# Patient Record
Sex: Female | Born: 1990 | Race: White | Hispanic: Yes | Marital: Married | Smoking: Never smoker
Health system: Southern US, Community
[De-identification: ages and names within clinical notes are randomized; demographics above are authoritative.]

## PROBLEM LIST (undated history)

## (undated) DIAGNOSIS — Z789 Other specified health status: Secondary | ICD-10-CM

## (undated) DIAGNOSIS — T7840XA Allergy, unspecified, initial encounter: Secondary | ICD-10-CM

## (undated) HISTORY — DX: Allergy, unspecified, initial encounter: T78.40XA

## (undated) HISTORY — PX: NO PAST SURGERIES: SHX2092

## (undated) HISTORY — DX: Other specified health status: Z78.9

---
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
## 2011-03-07 LAB — POCT RAPID STREP A: Streptococcus, Group A Screen (Direct): NEGATIVE

## 2011-05-25 ENCOUNTER — Inpatient Hospital Stay (HOSPITAL_COMMUNITY): Payer: Medicaid Other

## 2011-05-25 ENCOUNTER — Inpatient Hospital Stay (HOSPITAL_COMMUNITY)
Admission: AD | Admit: 2011-05-25 | Discharge: 2011-05-25 | Disposition: A | Discharge status: Home / Self Care | Payer: Medicaid Other | Source: Ambulatory Visit | Attending: Obstetrics & Gynecology | Admitting: Obstetrics & Gynecology

## 2011-05-25 ENCOUNTER — Encounter (HOSPITAL_COMMUNITY): Payer: Self-pay | Admitting: *Deleted

## 2011-05-25 DIAGNOSIS — O2390 Unspecified genitourinary tract infection in pregnancy, unspecified trimester: Secondary | ICD-10-CM | POA: Insufficient documentation

## 2011-05-25 DIAGNOSIS — O26899 Other specified pregnancy related conditions, unspecified trimester: Secondary | ICD-10-CM

## 2011-05-25 DIAGNOSIS — O219 Vomiting of pregnancy, unspecified: Secondary | ICD-10-CM

## 2011-05-25 DIAGNOSIS — B373 Candidiasis of vulva and vagina: Secondary | ICD-10-CM

## 2011-05-25 DIAGNOSIS — B3731 Acute candidiasis of vulva and vagina: Secondary | ICD-10-CM | POA: Insufficient documentation

## 2011-05-25 DIAGNOSIS — R109 Unspecified abdominal pain: Secondary | ICD-10-CM

## 2011-05-25 DIAGNOSIS — O210 Mild hyperemesis gravidarum: Secondary | ICD-10-CM | POA: Insufficient documentation

## 2011-05-25 LAB — CBC
MCH: 28.1 pg (ref 26.0–34.0)
MCV: 84.4 fL (ref 78.0–100.0)
Platelets: 189 10*3/uL (ref 150–400)
RDW: 14.7 % (ref 11.5–15.5)
WBC: 9.9 10*3/uL (ref 4.0–10.5)

## 2011-05-25 LAB — WET PREP, GENITAL: Clue Cells Wet Prep HPF POC: NONE SEEN

## 2011-05-25 LAB — COMPREHENSIVE METABOLIC PANEL
AST: 19 U/L (ref 0–37)
Albumin: 3.5 g/dL (ref 3.5–5.2)
Calcium: 10.2 mg/dL (ref 8.4–10.5)
Creatinine, Ser: 0.5 mg/dL (ref 0.50–1.10)
Total Protein: 7.4 g/dL (ref 6.0–8.3)

## 2011-05-25 LAB — URINALYSIS, ROUTINE W REFLEX MICROSCOPIC
Bilirubin Urine: NEGATIVE
Hgb urine dipstick: NEGATIVE
Nitrite: NEGATIVE
Protein, ur: NEGATIVE mg/dL
Urobilinogen, UA: 0.2 mg/dL (ref 0.0–1.0)

## 2011-05-25 LAB — URINE MICROSCOPIC-ADD ON

## 2011-05-25 MED ORDER — FLUCONAZOLE 150 MG PO TABS: 150.0000 mg | ORAL_TABLET | Freq: Once | ORAL | Status: AC

## 2011-05-25 MED ORDER — ONDANSETRON 8 MG PO TBDP: 8.0000 mg | ORAL_TABLET | Freq: Three times a day (TID) | ORAL | Status: AC | PRN

## 2011-05-25 MED ORDER — FLUCONAZOLE 150 MG PO TABS: 150.0000 mg | ORAL_TABLET | Freq: Once | ORAL | Status: DC

## 2011-05-25 MED ORDER — PROMETHAZINE HCL 25 MG PO TABS: 25.0000 mg | ORAL_TABLET | Freq: Four times a day (QID) | ORAL | Status: AC | PRN

## 2011-05-25 MED ORDER — ONDANSETRON 8 MG PO TBDP
8.0000 mg | ORAL_TABLET | Freq: Once | ORAL | Status: AC
  Administered 2011-05-25: 8 mg via ORAL
  Filled 2011-05-25: qty 1

## 2011-05-25 NOTE — ED Provider Notes (Signed)
History     Chief Complaint  Patient presents with  . Emesis   HPI Pt presents with nausea and vomiting in pregnancy.  She has been vomiting about 3 times a day.  She also complains of abdominal cramping on and off- she has not taken any medications for the pain.  She was diagnosed with at UTI at her [facility] and took AMmoxicillin.  She had some spotting 1 1/2 month ago.    No past medical history on file.  No past surgical history on file.  No family history on file.  History  Substance Use Topics  . Smoking status: Not on file  . Smokeless tobacco: Not on file  . Alcohol Use: Not on file    Allergies: Allergies not on file  No prescriptions prior to admission    ROS Physical Exam   Blood pressure 122/75, pulse 75, temperature 98.7 F (37.1 C), temperature source Oral, resp. rate 18, height 5' 1.5" (1.562 m), weight 137 lb 6.4 oz (62.324 kg), last menstrual period 02/28/2011, SpO2 98.00%.  Physical Exam  Vitals reviewed. Constitutional: She is oriented to person, place, and time. She appears well-developed and well-nourished.  HENT:  Head: Normocephalic.  Eyes: Pupils are equal, round, and reactive to light.  Neck: Normal range of motion. Neck supple.  Cardiovascular: Normal rate.   Respiratory: Effort normal.  GI: Soft. She exhibits no distension. There is no tenderness. There is no rebound and no guarding.  Genitourinary: Vagina normal.       Uterus 12 weeks size NT FHT audible with doppler Cervix clean, NT Adnexa without palpable enlargement or tenderness  Musculoskeletal: Normal range of motion.  Neurological: She is alert and oriented to person, place, and time.  Skin: Skin is warm and dry.  Psychiatric: She has a normal mood and affect.    MAU Course  Procedures Results for orders placed during the hospital encounter of 05/25/11 (from the past 24 hour(s))  URINALYSIS, ROUTINE W REFLEX MICROSCOPIC     Status: Abnormal   Collection Time   05/25/11 11:15 AM      Component Value Range   Color, Urine YELLOW  YELLOW    APPearance HAZY (*) CLEAR    Specific Gravity, Urine 1.020  1.005 - 1.030    pH 6.5  5.0 - 8.0    Glucose, UA NEGATIVE  NEGATIVE (mg/dL)   Hgb urine dipstick NEGATIVE  NEGATIVE    Bilirubin Urine NEGATIVE  NEGATIVE    Ketones, ur NEGATIVE  NEGATIVE (mg/dL)   Protein, ur NEGATIVE  NEGATIVE (mg/dL)   Urobilinogen, UA 0.2  0.0 - 1.0 (mg/dL)   Nitrite NEGATIVE  NEGATIVE    Leukocytes, UA LARGE (*) NEGATIVE   URINE MICROSCOPIC-ADD ON     Status: Abnormal   Collection Time   05/25/11 11:15 AM      Component Value Range   Squamous Epithelial / LPF FEW (*) RARE    WBC, UA 3-6  <3 (WBC/hpf)   RBC / HPF 0-2  <3 (RBC/hpf)   Bacteria, UA MANY (*) RARE   CBC     Status: Normal   Collection Time   05/25/11 11:32 AM      Component Value Range   WBC 9.9  4.0 - 10.5 (K/uL)   RBC 4.81  3.87 - 5.11 (MIL/uL)   Hemoglobin 13.5  12.0 - 15.0 (g/dL)   HCT 40.9  81.1 - 91.4 (%)   MCV 84.4  78.0 -  100.0 (fL)   MCH 28.1  26.0 - 34.0 (pg)   MCHC 33.3  30.0 - 36.0 (g/dL)   RDW 16.1  09.6 - 04.5 (%)   Platelets 189  150 - 400 (K/uL)  COMPREHENSIVE METABOLIC PANEL     Status: Abnormal   Collection Time   05/25/11 11:32 AM      Component Value Range   Sodium 136  135 - 145 (mEq/L)   Potassium 4.2  3.5 - 5.1 (mEq/L)   Chloride 102  96 - 112 (mEq/L)   CO2 25  19 - 32 (mEq/L)   Glucose, Bld 85  70 - 99 (mg/dL)   BUN 7  6 - 23 (mg/dL)   Creatinine, Ser 4.09  0.50 - 1.10 (mg/dL)   Calcium 81.1  8.4 - 10.5 (mg/dL)   Total Protein 7.4  6.0 - 8.3 (g/dL)   Albumin 3.5  3.5 - 5.2 (g/dL)   AST 19  0 - 37 (U/L)   ALT 15  0 - 35 (U/L)   Alkaline Phosphatase 65  39 - 117 (U/L)   Total Bilirubin 0.2 (*) 0.3 - 1.2 (mg/dL)   GFR calc non Af Amer >90  >90 (mL/min)   GFR calc Af Amer >90  >90 (mL/min)  WET PREP, GENITAL     Status: Abnormal   Collection Time   05/25/11  2:50 PM      Component Value Range   Yeast,  Wet Prep MODERATE (*) NONE SEEN    Trich, Wet Prep NONE SEEN  NONE SEEN    Clue Cells, Wet Prep NONE SEEN  NONE SEEN    WBC, Wet Prep HPF POC MANY (*) NONE SEEN    Ultrasound showed [redacted]w[redacted]d single IUP   Assessment and Plan  Single IUP [redacted]w[redacted]d pregnancy Yeast vaginitis F/u with NOB appointment  Colleen Villegas 05/25/2011, 12:34 PM

## 2011-05-25 NOTE — ED Notes (Signed)
Gait steady, got up quickly and started walking.

## 2011-05-25 NOTE — Progress Notes (Signed)
Ongoing vomiting with preg.  Has continued, vomiting is giving her a headache, makes her dizzy.  Wondering if she is dehydrated.

## 2011-05-26 LAB — URINE CULTURE

## 2011-05-26 LAB — GC/CHLAMYDIA PROBE AMP, GENITAL: Chlamydia, DNA Probe: NEGATIVE

## 2011-06-13 NOTE — L&D Delivery Note (Signed)
Delivery Note At 11:36 PM a viable female was delivered via Vaginal, Spontaneous Delivery (Presentation: ;  ).  APGAR: 7, 10; weight .   Placenta status: , Manual removal.  Cord:  with the following complications: .  Cord pH: not done  Anesthesia: Epidural  Episiotomy:  Lacerations:  Suture Repair: 2.0 vicryl Est. Blood Loss (mL):   Mom to postpartum.  Baby to nursery-stable.  Colleen Villegas A 12/08/2011, 11:50 PM   Delivery Note At 11:36 PM a viable female was delivered via Vaginal, Spontaneous Delivery (Presentation: ;  ).  APGAR: 7, 10; weight .   Placenta status: , Manual removal.  Cord:  with the following complications: .  Cord pH: not done  Anesthesia: Epidural  Episiotomy:  Lacerations:  Suture Repair: 2.0 vicryl Est. Blood Loss (mL):   Mom to postpartum.  Baby to nursery-stable.  Colleen Villegas A 12/08/2011, 11:50 PM

## 2011-06-27 LAB — OB RESULTS CONSOLE ABO/RH: RH Type: POSITIVE

## 2011-06-27 LAB — OB RESULTS CONSOLE HIV ANTIBODY (ROUTINE TESTING): HIV: NONREACTIVE

## 2011-11-02 LAB — OB RESULTS CONSOLE GBS: GBS: NEGATIVE

## 2011-12-07 ENCOUNTER — Telehealth (HOSPITAL_COMMUNITY): Payer: Self-pay | Admitting: *Deleted

## 2011-12-07 ENCOUNTER — Other Ambulatory Visit: Payer: Self-pay | Admitting: Obstetrics

## 2011-12-07 ENCOUNTER — Encounter (HOSPITAL_COMMUNITY): Payer: Self-pay | Admitting: *Deleted

## 2011-12-07 NOTE — Telephone Encounter (Signed)
Preadmission screen  

## 2011-12-08 ENCOUNTER — Inpatient Hospital Stay (HOSPITAL_COMMUNITY)
Admission: AD | Admit: 2011-12-08 | Discharge: 2011-12-10 | DRG: 774 | Disposition: A | Discharge status: Home / Self Care | Payer: Medicaid Other | Source: Ambulatory Visit | Attending: Obstetrics | Admitting: Obstetrics

## 2011-12-08 ENCOUNTER — Inpatient Hospital Stay (HOSPITAL_COMMUNITY): Payer: Medicaid Other | Admitting: Anesthesiology

## 2011-12-08 ENCOUNTER — Encounter (HOSPITAL_COMMUNITY): Payer: Self-pay | Admitting: *Deleted

## 2011-12-08 ENCOUNTER — Encounter (HOSPITAL_COMMUNITY): Payer: Self-pay | Admitting: Anesthesiology

## 2011-12-08 LAB — CBC
Platelets: 148 10*3/uL — ABNORMAL LOW (ref 150–400)
RDW: 14.7 % (ref 11.5–15.5)
WBC: 11.1 10*3/uL — ABNORMAL HIGH (ref 4.0–10.5)

## 2011-12-08 LAB — TYPE AND SCREEN
ABO/RH(D): O POS
Antibody Screen: NEGATIVE

## 2011-12-08 LAB — RPR: RPR Ser Ql: NONREACTIVE

## 2011-12-08 MED ORDER — ONDANSETRON HCL 4 MG/2ML IJ SOLN
4.0000 mg | Freq: Four times a day (QID) | INTRAMUSCULAR | Status: DC | PRN
  Administered 2011-12-08 (×2): 4 mg via INTRAVENOUS
  Filled 2011-12-08 (×2): qty 2

## 2011-12-08 MED ORDER — CITRIC ACID-SODIUM CITRATE 334-500 MG/5ML PO SOLN: 30.0000 mL | ORAL | Status: DC | PRN

## 2011-12-08 MED ORDER — ACETAMINOPHEN 325 MG PO TABS
650.0000 mg | ORAL_TABLET | ORAL | Status: DC | PRN
  Administered 2011-12-08: 650 mg via ORAL
  Filled 2011-12-08: qty 2

## 2011-12-08 MED ORDER — PROMETHAZINE HCL 25 MG/ML IJ SOLN: 25.0000 mg | Freq: Four times a day (QID) | INTRAMUSCULAR | Status: DC | PRN

## 2011-12-08 MED ORDER — OXYCODONE-ACETAMINOPHEN 5-325 MG PO TABS
1.0000 | ORAL_TABLET | ORAL | Status: DC | PRN
  Administered 2011-12-09: 2 via ORAL
  Administered 2011-12-09: 1 via ORAL
  Administered 2011-12-09 (×2): 2 via ORAL
  Filled 2011-12-08 (×2): qty 2
  Filled 2011-12-08 (×2): qty 1

## 2011-12-08 MED ORDER — FLEET ENEMA 7-19 GM/118ML RE ENEM: 1.0000 | ENEMA | RECTAL | Status: DC | PRN

## 2011-12-08 MED ORDER — OXYTOCIN BOLUS FROM INFUSION
250.0000 mL | Freq: Once | INTRAVENOUS | Status: AC
  Administered 2011-12-08: 250 mL via INTRAVENOUS
  Filled 2011-12-08: qty 500

## 2011-12-08 MED ORDER — PHENYLEPHRINE 40 MCG/ML (10ML) SYRINGE FOR IV PUSH (FOR BLOOD PRESSURE SUPPORT)
80.0000 ug | PREFILLED_SYRINGE | INTRAVENOUS | Status: DC | PRN
  Filled 2011-12-08: qty 2

## 2011-12-08 MED ORDER — LIDOCAINE HCL (PF) 1 % IJ SOLN
30.0000 mL | INTRAMUSCULAR | Status: DC | PRN
Start: 2011-12-08 — End: 2011-12-10
  Administered 2011-12-08: 30 mL via SUBCUTANEOUS
  Filled 2011-12-08: qty 30

## 2011-12-08 MED ORDER — NALBUPHINE SYRINGE 5 MG/0.5 ML
10.0000 mg | INJECTION | INTRAMUSCULAR | Status: DC | PRN
  Filled 2011-12-08: qty 1

## 2011-12-08 MED ORDER — OXYTOCIN 40 UNITS IN LACTATED RINGERS INFUSION - SIMPLE MED
1.0000 m[IU]/min | INTRAVENOUS | Status: DC
  Administered 2011-12-08: 2 m[IU]/min via INTRAVENOUS
  Filled 2011-12-08: qty 1000

## 2011-12-08 MED ORDER — OXYTOCIN 40 UNITS IN LACTATED RINGERS INFUSION - SIMPLE MED
62.5000 mL/h | Freq: Once | INTRAVENOUS | Status: AC
  Administered 2011-12-09: 62.5 mL/h via INTRAVENOUS

## 2011-12-08 MED ORDER — LACTATED RINGERS IV SOLN
INTRAVENOUS | Status: DC
  Administered 2011-12-08 (×2): 125 mL/h via INTRAVENOUS
  Administered 2011-12-08 (×3): via INTRAVENOUS

## 2011-12-08 MED ORDER — DIPHENHYDRAMINE HCL 50 MG/ML IJ SOLN: 12.5000 mg | INTRAMUSCULAR | Status: DC | PRN

## 2011-12-08 MED ORDER — NALBUPHINE SYRINGE 5 MG/0.5 ML
10.0000 mg | INJECTION | Freq: Four times a day (QID) | INTRAMUSCULAR | Status: DC | PRN
  Filled 2011-12-08: qty 1

## 2011-12-08 MED ORDER — FENTANYL 2.5 MCG/ML BUPIVACAINE 1/10 % EPIDURAL INFUSION (WH - ANES)
14.0000 mL/h | INTRAMUSCULAR | Status: DC
  Administered 2011-12-08 (×5): 14 mL/h via EPIDURAL
  Filled 2011-12-08 (×5): qty 60

## 2011-12-08 MED ORDER — EPHEDRINE 5 MG/ML INJ
10.0000 mg | INTRAVENOUS | Status: DC | PRN
  Administered 2011-12-08: 10 mg via INTRAVENOUS
  Filled 2011-12-08: qty 4
  Filled 2011-12-08: qty 2

## 2011-12-08 MED ORDER — LACTATED RINGERS IV SOLN: 500.0000 mL | INTRAVENOUS | Status: DC | PRN

## 2011-12-08 MED ORDER — EPHEDRINE 5 MG/ML INJ
10.0000 mg | INTRAVENOUS | Status: DC | PRN
  Filled 2011-12-08: qty 2

## 2011-12-08 MED ORDER — LIDOCAINE HCL (PF) 1 % IJ SOLN
INTRAMUSCULAR | Status: DC | PRN
  Administered 2011-12-08 (×2): 5 mL

## 2011-12-08 MED ORDER — TERBUTALINE SULFATE 1 MG/ML IJ SOLN: 0.2500 mg | Freq: Once | INTRAMUSCULAR | Status: AC | PRN

## 2011-12-08 MED ORDER — IBUPROFEN 600 MG PO TABS
600.0000 mg | ORAL_TABLET | Freq: Four times a day (QID) | ORAL | Status: DC | PRN
  Filled 2011-12-08 (×3): qty 1

## 2011-12-08 MED ORDER — PHENYLEPHRINE 40 MCG/ML (10ML) SYRINGE FOR IV PUSH (FOR BLOOD PRESSURE SUPPORT)
80.0000 ug | PREFILLED_SYRINGE | INTRAVENOUS | Status: DC | PRN
  Filled 2011-12-08: qty 2
  Filled 2011-12-08: qty 5

## 2011-12-08 MED ORDER — LACTATED RINGERS IV SOLN
500.0000 mL | Freq: Once | INTRAVENOUS | Status: AC
  Administered 2011-12-08: 1000 mL via INTRAVENOUS

## 2011-12-08 NOTE — Anesthesia Preprocedure Evaluation (Signed)

## 2011-12-08 NOTE — Progress Notes (Signed)
Colleen Villegas is a 21 y.o. G1P0000 at [redacted]w[redacted]d by LMP admitted for active labor, rupture of membranes  Subjective:   Objective: BP 123/64  Pulse 84  Temp 98.1 F (36.7 C) (Oral)  Resp 18  Ht 5\' 2"  (1.575 m)  Wt 78.472 kg (173 lb)  BMI 31.64 kg/m2  LMP 02/28/2011      FHT:  FHR: 150 bpm, variability: moderate,  accelerations:  Present,  decelerations:  Absent UC:   regular, every 3-4 minutes SVE:   Dilation: 4 Effacement (%): 80 Station: -2 Exam by:: Colleen Lucks, RN  Labs: Lab Results  Component Value Date   WBC 11.1* 12/08/2011   HGB 12.5 12/08/2011   HCT 38.7 12/08/2011   MCV 83.4 12/08/2011   PLT 148* 12/08/2011    Assessment / Plan: Spontaneous labor, progressing normally  Labor: Progressing normally Preeclampsia:  n/a Fetal Wellbeing:  Category I Pain Control:  Labor support without medications I/D:  n/a Anticipated MOD:  NSVD  Colleen Villegas A 12/08/2011, 7:44 AM

## 2011-12-08 NOTE — MAU Note (Signed)
Pt G1 at 40.3wks leaking clear fluid since 0330 and having contractions.  Denies any problems with pregnancy.  GBS neg.

## 2011-12-08 NOTE — Anesthesia Procedure Notes (Signed)
Epidural Patient location during procedure: OB Start time: 12/08/2011 7:52 AM  Staffing Anesthesiologist: Brayton Caves R Performed by: anesthesiologist   Preanesthetic Checklist Completed: patient identified, site marked, surgical consent, pre-op evaluation, timeout performed, IV checked, risks and benefits discussed and monitors and equipment checked  Epidural Patient position: sitting Prep: site prepped and draped and DuraPrep Patient monitoring: continuous pulse ox and blood pressure Approach: midline Injection technique: LOR air and LOR saline  Needle:  Needle type: Tuohy  Needle gauge: 17 G Needle length: 9 cm Needle insertion depth: 5 cm cm Catheter type: closed end flexible Catheter size: 19 Gauge Catheter at skin depth: 10 cm Test dose: negative  Assessment Events: blood not aspirated, injection not painful, no injection resistance, negative IV test and no paresthesia  Additional Notes Patient identified.  Risk benefits discussed including failed block, incomplete pain control, headache, nerve damage, paralysis, blood pressure changes, nausea, vomiting, reactions to medication both toxic or allergic, and postpartum back pain.  Patient expressed understanding and wished to proceed.  All questions were answered.  Sterile technique used throughout procedure and epidural site dressed with sterile barrier dressing. No paresthesia or other complications noted.The patient did not experience any signs of intravascular injection such as tinnitus or metallic taste in mouth nor signs of intrathecal spread such as rapid motor block. Please see nursing notes for vital signs.

## 2011-12-08 NOTE — Progress Notes (Signed)
Colleen Villegas is Villegas 21 y.o. G1P0000 at [redacted]w[redacted]d by LMP admitted for active labor  Subjective:   Objective: BP 125/64  Pulse 73  Temp 97.5 F (36.4 C) (Oral)  Resp 20  Ht 5\' 2"  (1.575 m)  Wt 78.472 kg (173 lb)  BMI 31.64 kg/m2  SpO2 100%  LMP 02/28/2011   Total I/O In: -  Out: 150 [Urine:150]  FHT:  FHR: 150 bpm, variability: moderate,  accelerations:  Present,  decelerations:  Absent UC:   regular, every 3 minutes SVE:   Dilation: 7 Effacement (%): 100 Station: 0 Exam by:: Colleen Lucks, RN  Labs: Lab Results  Component Value Date   WBC 11.1* 12/08/2011   HGB 12.5 12/08/2011   HCT 38.7 12/08/2011   MCV 83.4 12/08/2011   PLT 148* 12/08/2011    Assessment / Plan: Augmentation of labor, progressing well  Labor: Progressing normally Preeclampsia:  n/Villegas Fetal Wellbeing:  Category I Pain Control:  Epidural I/D:  n/Villegas Anticipated MOD:  NSVD  Colleen Villegas 12/08/2011, 3:19 PM

## 2011-12-08 NOTE — Progress Notes (Signed)
Colleen Villegas is a 21 y.o. G1P0000 at [redacted]w[redacted]d by LMP admitted for active labor  Subjective:   Objective: BP 123/66  Pulse 55  Temp 98.1 F (36.7 C) (Oral)  Resp 20  Ht 5\' 2"  (1.575 m)  Wt 78.472 kg (173 lb)  BMI 31.64 kg/m2  SpO2 100%  LMP 02/28/2011   Total I/O In: -  Out: 150 [Urine:150]  FHT:  FHR: 150 bpm, variability: moderate,  accelerations:  Present,  decelerations:  Absent UC:   regular, every 3-6 minutes SVE:   Dilation: 4.5 Effacement (%): 80 Station: -2 Exam by:: Valentina Lucks, RN  Labs: Lab Results  Component Value Date   WBC 11.1* 12/08/2011   HGB 12.5 12/08/2011   HCT 38.7 12/08/2011   MCV 83.4 12/08/2011   PLT 148* 12/08/2011    Assessment / Plan: Spontaneous labor, progressing normally.  Pitocin prn.  Labor: Progressing normally Preeclampsia:  n/a Fetal Wellbeing:  Category I Pain Control:  Epidural I/D:  n/a Anticipated MOD:  NSVD  Chaddrick Brue A 12/08/2011, 11:09 AM

## 2011-12-08 NOTE — H&P (Signed)
Colleen Villegas is a 21 y.o. female presenting for SROM and UC's. Maternal Medical History:  Reason for admission: Reason for admission: rupture of membranes and contractions.  Contractions: Onset was 6-12 hours ago.   Frequency: regular.   Perceived severity is strong.    Fetal activity: Perceived fetal activity is normal.   Last perceived fetal movement was within the past hour.    Prenatal complications: no prenatal complications Prenatal Complications - Diabetes: none.    OB History    Grav Para Term Preterm Abortions TAB SAB Ect Mult Living   1 0 0 0 0 0 0 0 0 0      Past Medical History  Diagnosis Date  . No pertinent past medical history    Past Surgical History  Procedure Date  . No past surgeries    Family History: family history includes Diabetes in her paternal aunt and Hypertension in her paternal grandmother.  There is no history of Anesthesia problems. Social History:  reports that she has never smoked. She has never used smokeless tobacco. She reports that she does not drink alcohol or use illicit drugs.   Prenatal Transfer Tool  Maternal Diabetes: No Genetic Screening: Declined Maternal Ultrasounds/Referrals: Normal Fetal Ultrasounds or other Referrals:  None Maternal Substance Abuse:  No Significant Maternal Medications:  Meds include: Other: see prenatal record Significant Maternal Lab Results:  Lab values include: Other: see prenatal record Other Comments:  None  Review of Systems  All other systems reviewed and are negative.    Dilation: 4 Effacement (%): 80 Station: -2 Exam by:: Valentina Lucks, RN Blood pressure 123/64, pulse 84, temperature 98.1 F (36.7 C), temperature source Oral, resp. rate 18, height 5\' 2"  (1.575 m), weight 78.472 kg (173 lb), last menstrual period 02/28/2011. Maternal Exam:  Uterine Assessment: Contraction strength is firm.  Contraction frequency is regular.   Abdomen: Patient reports no abdominal tenderness. Introitus:  Normal vulva. Normal vagina.    Physical Exam  Constitutional: She is oriented to person, place, and time. She appears well-developed and well-nourished.  HENT:  Head: Normocephalic.  Eyes: Conjunctivae are normal. Pupils are equal, round, and reactive to light.  Neck: Normal range of motion. Neck supple.  Cardiovascular: Normal rate and regular rhythm.   Respiratory: Effort normal.  GI: Soft.  Genitourinary: Vagina normal and uterus normal.  Musculoskeletal: Normal range of motion.  Neurological: She is alert and oriented to person, place, and time.  Skin: Skin is warm and dry.  Psychiatric: She has a normal mood and affect. Her behavior is normal. Judgment and thought content normal.    Prenatal labs: ABO, Rh: O/Positive/-- (01/15 0000) Antibody: Negative (01/15 0000) Rubella: Immune (01/15 0000) RPR: Nonreactive (01/15 0000)  HBsAg: Negative (01/15 0000)  HIV: Non-reactive (01/15 0000)  GBS: Negative (05/23 0000)   Assessment/Plan: 40.3 weeks.  SROM.  Early labor.  Expectant management.   Zabella Wease A 12/08/2011, 7:35 AM

## 2011-12-09 ENCOUNTER — Encounter (HOSPITAL_COMMUNITY): Payer: Self-pay | Admitting: *Deleted

## 2011-12-09 LAB — CBC
HCT: 35.5 % — ABNORMAL LOW (ref 36.0–46.0)
Hemoglobin: 11.3 g/dL — ABNORMAL LOW (ref 12.0–15.0)
MCH: 26.8 pg (ref 26.0–34.0)
RBC: 4.21 MIL/uL (ref 3.87–5.11)

## 2011-12-09 MED ORDER — NITROFURANTOIN MONOHYD MACRO 100 MG PO CAPS
100.0000 mg | ORAL_CAPSULE | Freq: Two times a day (BID) | ORAL | Status: DC
  Administered 2011-12-09 – 2011-12-10 (×2): 100 mg via ORAL
  Filled 2011-12-09 (×3): qty 1

## 2011-12-09 MED ORDER — DIBUCAINE 1 % RE OINT: 1.0000 "application " | TOPICAL_OINTMENT | RECTAL | Status: DC | PRN

## 2011-12-09 MED ORDER — SIMETHICONE 80 MG PO CHEW: 80.0000 mg | CHEWABLE_TABLET | ORAL | Status: DC | PRN

## 2011-12-09 MED ORDER — TAMSULOSIN HCL 0.4 MG PO CAPS
0.4000 mg | ORAL_CAPSULE | Freq: Every day | ORAL | Status: DC
  Administered 2011-12-09 – 2011-12-10 (×2): 0.4 mg via ORAL
  Filled 2011-12-09 (×3): qty 1

## 2011-12-09 MED ORDER — FERROUS SULFATE 325 (65 FE) MG PO TABS
325.0000 mg | ORAL_TABLET | Freq: Two times a day (BID) | ORAL | Status: DC
  Administered 2011-12-09 – 2011-12-10 (×3): 325 mg via ORAL
  Filled 2011-12-09 (×3): qty 1

## 2011-12-09 MED ORDER — ONDANSETRON HCL 4 MG PO TABS: 4.0000 mg | ORAL_TABLET | ORAL | Status: DC | PRN

## 2011-12-09 MED ORDER — PRENATAL MULTIVITAMIN CH
1.0000 | ORAL_TABLET | Freq: Every day | ORAL | Status: DC
  Administered 2011-12-09 – 2011-12-10 (×2): 1 via ORAL
  Filled 2011-12-09 (×2): qty 1

## 2011-12-09 MED ORDER — LACTATED RINGERS IV BOLUS (SEPSIS)
500.0000 mL | Freq: Once | INTRAVENOUS | Status: AC
  Administered 2011-12-09: 1000 mL via INTRAVENOUS

## 2011-12-09 MED ORDER — WITCH HAZEL-GLYCERIN EX PADS: 1.0000 "application " | MEDICATED_PAD | CUTANEOUS | Status: DC | PRN

## 2011-12-09 MED ORDER — OXYCODONE-ACETAMINOPHEN 5-325 MG PO TABS
1.0000 | ORAL_TABLET | ORAL | Status: DC | PRN
  Administered 2011-12-10: 1 via ORAL
  Filled 2011-12-09: qty 2

## 2011-12-09 MED ORDER — TETANUS-DIPHTH-ACELL PERTUSSIS 5-2.5-18.5 LF-MCG/0.5 IM SUSP: 0.5000 mL | Freq: Once | INTRAMUSCULAR | Status: DC

## 2011-12-09 MED ORDER — ONDANSETRON HCL 4 MG/2ML IJ SOLN: 4.0000 mg | INTRAMUSCULAR | Status: DC | PRN

## 2011-12-09 MED ORDER — SENNOSIDES-DOCUSATE SODIUM 8.6-50 MG PO TABS
2.0000 | ORAL_TABLET | Freq: Every day | ORAL | Status: DC
  Administered 2011-12-09: 2 via ORAL

## 2011-12-09 MED ORDER — IBUPROFEN 600 MG PO TABS
600.0000 mg | ORAL_TABLET | Freq: Four times a day (QID) | ORAL | Status: DC
  Administered 2011-12-09 – 2011-12-10 (×5): 600 mg via ORAL
  Filled 2011-12-09 (×2): qty 1

## 2011-12-09 MED ORDER — LANOLIN HYDROUS EX OINT: TOPICAL_OINTMENT | CUTANEOUS | Status: DC | PRN

## 2011-12-09 MED ORDER — ZOLPIDEM TARTRATE 5 MG PO TABS: 5.0000 mg | ORAL_TABLET | Freq: Every evening | ORAL | Status: DC | PRN

## 2011-12-09 MED ORDER — DIPHENHYDRAMINE HCL 25 MG PO CAPS: 25.0000 mg | ORAL_CAPSULE | Freq: Four times a day (QID) | ORAL | Status: DC | PRN

## 2011-12-09 MED ORDER — BENZOCAINE-MENTHOL 20-0.5 % EX AERO
1.0000 "application " | INHALATION_SPRAY | CUTANEOUS | Status: DC | PRN
  Administered 2011-12-09: 1 via TOPICAL
  Filled 2011-12-09 (×3): qty 56

## 2011-12-09 NOTE — Anesthesia Postprocedure Evaluation (Signed)
  Anesthesia Post-op Note  Patient: Colleen Villegas  Procedure(s) Performed: * No procedures listed *  Patient Location: Mother/Baby  Anesthesia Type: Epidural  Level of Consciousness: awake, alert  and oriented  Airway and Oxygen Therapy: Patient Spontanous Breathing  Post-op Pain: mild  Post-op Assessment: Patient's Cardiovascular Status Stable and Respiratory Function Stable  Post-op Vital Signs: stable  Complications: No apparent anesthesia complications

## 2011-12-09 NOTE — Progress Notes (Signed)
Patient ID: Colleen Villegas, female   DOB: [birth year], 21 y.o.   MRN: [id number] Postpartum day one Vital signs normal fundus firm legs negative Has had difficulty voiding she's just been catheterized for the first time legs negative

## 2011-12-10 NOTE — Discharge Summary (Signed)
Obstetric Discharge Summary Reason for Admission: onset of labor Prenatal Procedures: none Intrapartum Procedures: spontaneous vaginal delivery Postpartum Procedures: none Complications-Operative and Postpartum: none Hemoglobin  Date Value Range Status  12/09/2011 11.3* 12.0 - 15.0 g/dL Final     HCT  Date Value Range Status  12/09/2011 35.5* 36.0 - 46.0 % Final    Physical Exam:  General: alert Lochia: appropriate Uterine Fundus: firm Incision: healing well DVT Evaluation: No evidence of DVT seen on physical exam.  Discharge Diagnoses: Term Pregnancy-delivered  Discharge Information: Date: 12/10/2011 Activity: pelvic rest Diet: routine Medications: Percocet Condition: stable Instructions: refer to practice specific booklet Discharge to: home Follow-up Information    Follow up with HARPER,CHARLES A, MD in 6 weeks.   Contact information:   [address] [place] [address] [phone]          Newborn Data: Live born female  Birth Weight: 8 lb 5.9 oz (3796 g) APGAR: 7, 10  Home with mother.  Colleen Villegas A 12/10/2011, 6:23 AM

## 2011-12-11 ENCOUNTER — Inpatient Hospital Stay (HOSPITAL_COMMUNITY)
Admission: AD | Admit: 2011-12-11 | Discharge: 2011-12-11 | Disposition: A | Discharge status: Home / Self Care | Payer: Medicaid Other | Source: Ambulatory Visit | Attending: Obstetrics & Gynecology | Admitting: Obstetrics & Gynecology

## 2011-12-11 DIAGNOSIS — N949 Unspecified condition associated with female genital organs and menstrual cycle: Secondary | ICD-10-CM

## 2011-12-11 DIAGNOSIS — O909 Complication of the puerperium, unspecified: Secondary | ICD-10-CM | POA: Insufficient documentation

## 2011-12-11 DIAGNOSIS — G8918 Other acute postprocedural pain: Secondary | ICD-10-CM

## 2011-12-11 LAB — URINALYSIS, ROUTINE W REFLEX MICROSCOPIC
Protein, ur: 30 mg/dL — AB
Urobilinogen, UA: 0.2 mg/dL (ref 0.0–1.0)

## 2011-12-11 LAB — URINE MICROSCOPIC-ADD ON

## 2011-12-11 MED ORDER — IBUPROFEN 800 MG PO TABS
800.0000 mg | ORAL_TABLET | Freq: Once | ORAL | Status: AC
  Administered 2011-12-11: 800 mg via ORAL
  Filled 2011-12-11: qty 1

## 2011-12-11 MED ORDER — OXYCODONE-ACETAMINOPHEN 5-325 MG PO TABS
1.0000 | ORAL_TABLET | Freq: Once | ORAL | Status: AC
  Administered 2011-12-11: 1 via ORAL
  Filled 2011-12-11: qty 1

## 2011-12-11 MED ORDER — IBUPROFEN 800 MG PO TABS: 800.0000 mg | ORAL_TABLET | Freq: Once | ORAL | Status: AC

## 2011-12-11 NOTE — MAU Note (Signed)
Pt reports she had a vaginal delivery on 12/08/2011 and had stitches and states the stitches are "really hurting a lot", states she is taking the pain meds they gave her but it isn't helping. Also reports after delivery she was unable to urinate and they had to I/o cath her 3 times and was able to void on her own on Sunday and was discharged home but states she does not feel the urge to void she just goes to the restroom and sits when she thinks she should go.

## 2011-12-11 NOTE — MAU Note (Signed)
Marie Williams CNM in with pt  

## 2011-12-11 NOTE — MAU Provider Note (Signed)
  History     CSN: [id number]  Arrival date and time: 12/11/11 4098   First Provider Initiated Contact with Patient 12/11/11 2301      Chief Complaint  Patient presents with  . Postpartum Complications   HPI This is a 21 y.o. female who is 3 days postpartum who presents with c/o pain at her stitches. Denies fever or unusual bleeding. Has had this pain since delivery. States pain med not working but does not take ibuprofen with it. Takes med irregularly.   OB History    Grav Para Term Preterm Abortions TAB SAB Ect Mult Living   1 1 1  0 0 0 0 0 0 1      Past Medical History  Diagnosis Date  . No pertinent past medical history     Past Surgical History  Procedure Date  . No past surgeries     Family History  Problem Relation Age of Onset  . Anesthesia problems Neg Hx   . Diabetes Paternal Aunt   . Hypertension Paternal Grandmother     History  Substance Use Topics  . Smoking status: Never Smoker   . Smokeless tobacco: Never Used  . Alcohol Use: No    Allergies: No Known Allergies  Prescriptions prior to admission  Medication Sig Dispense Refill  . docusate sodium (COLACE) 100 MG capsule Take 100 mg by mouth daily as needed. For constipation      . oxyCODONE-acetaminophen (PERCOCET) 5-325 MG per tablet Take 1 tablet by mouth every 4 (four) hours as needed. For pain      . Tamsulosin HCl (FLOMAX) 0.4 MG CAPS Take 0.4 mg by mouth.        ROS As listed in HPI  Physical Exam   Blood pressure 128/68, pulse 61, temperature 99.1 F (37.3 C), temperature source Oral, resp. rate 18, height 5\' 2"  (1.575 m), weight 173 lb (78.472 kg), last menstrual period 02/28/2011, SpO2 100.00%, currently breastfeeding.  Physical Exam  Constitutional: She is oriented to person, place, and time. She appears well-developed and well-nourished. No distress.  Cardiovascular: Normal rate.   Respiratory: Effort normal.  GI: Soft. She exhibits no distension and no mass. There is no  tenderness. There is no rebound and no guarding.  Genitourinary: Uterus normal. Vaginal discharge found.       Several stitches noted at midline. No erethema. No dehiscence. Well approximated  Musculoskeletal: Normal range of motion.  Neurological: She is alert and oriented to person, place, and time.  Skin: Skin is warm.  Psychiatric: She has a normal mood and affect.   MAU Course  Procedures  MDM Recommended she add ibuprofen to percocet, add ice pack or warm sitz  Assessment and Plan  A:  Laceration pain  P:  Ibuprofen 600mg  q 6 hrs      Percocet PRN      Ice pack or sitz depending on which feels better      Followup with MD in office  [facility] 12/11/2011, 11:12 PM

## 2011-12-11 NOTE — Progress Notes (Signed)
Post discharge chart review completed.  

## 2011-12-11 NOTE — Discharge Instructions (Signed)
Episiotomy or Perineal Tear Care After Refer to this sheet in the next few weeks. These instructions provide you with information on caring for yourself after your episiotomy or perineal tear. Your caregiver may also give you more specific instructions. Your treatment has been planned according to current medical practices, but problems sometimes occur. Call your caregiver if you have any problems or questions after your episiotomy or delivery. An episiotomy is a surgical cut (incision) made in the perineum. The perineum is the area of skin between the vaginal opening and the anus.  HOME CARE INSTRUCTIONS   The first day, put ice on the episiotomy or torn area.   Put ice in a plastic bag.   Place a towel between your skin and the bag.   Leave the ice on for 15 to 20 minutes, 3 to 4 times a day.   Sit in warm sitz baths as directed by your caregiver. This can speed up healing. Make sure the water is not hot enough to cause burns. The use of sitz baths may be continued for several days, or as directed by your caregiver.   Keep the skin dry and clean. Use a towel to lightly clean the area of the episiotomy or tear. Avoid rubbing.   Wash your hands before and after applying medicine to the episiotomy or tear area.   Put about 3 hemorrhoid pads in your sanitary pad. The witch hazel in the hemorrhoid pads helps with the discomfort and swelling.   Get a peri-bottle to squeeze warm water on the perineum area when urinating. Pat the area to dry it.   Apply a numbing spray to the episiotomy or tear area as directed by your caregiver. This may help with discomfort.   Sitting on an inflatable ring or pillow may provide comfort.   Only take over-the-counter or prescription medicines for pain, discomfort, or fever as directed by your caregiver.   Do not have intercourse or use tampons until your caregiver says it is okay. Typically, you must wait at least 6 weeks.   Keep all postpartum appointments.   SEEK MEDICAL CARE IF:   You need stronger pain medicine.   You have painful urination.  SEEK IMMEDIATE MEDICAL CARE IF:   You have redness, swelling, or increasing pain in the episiotomy or tear area.   You have pus coming from the episiotomy or tear area.   You have a fever.   You notice a bad smell coming from the episiotomy or tear area.   Your episiotomy or tear opens.   You notice swelling that is larger than when you left the hospital in the episiotomy area.   You cannot urinate.  MAKE SURE YOU:  Understand these instructions.   Will watch your condition.   Will get help right away if you are not doing well or get worse.  Document Released: 05/29/2005 Document Revised: 05/18/2011 Document Reviewed: 04/03/2011 [facility] Patient Information 2012 [place], [place].Episiotoma o desgarro perineal, Cuidados posteriores (Episiotomy or Perineal Tear, Care After) Siga estas instrucciones durante las prximas semanas. Estas indicaciones le proporcionan informacin acerca de cmo deber cuidarse despus de la episiotoma o de un desgarro perineal. El mdico tambin podr darle instrucciones ms especficas. El tratamiento ha sido planificado segn las prcticas mdicas actuales, pero en algunos casos pueden ocurrir problemas. Comunquese con el mdico si tiene algn problema o tiene preguntas despus de la episiotoma o del parto. Una episiotoma es un corte quirrgico (incisin) que se Clinical cytogeneticist perineo. El  perineo es la zona de piel entre la abertura vaginal y el ano.  INSTRUCCIONES PARA EL CUIDADO EN EL HOGAR   El primer da aplique hielo en la zona de la episiotoma o rea desgarrada.   Ponga el hielo en una bolsa plstica.   Colquese una toalla entre la piel y la bolsa de hielo.   Deje el hielo durante 15 a 20 minutos, 3 a 4 veces por da.   Tome los baos de asiento tibios segn le indic su mdico. Estos pueden acelerar la curacin. Asegrese que el agua no est tan  caliente como para producirle [place]. Puede continuar con los baos de asiento durante [address], o segn le indique el mdico.   Mantenga la piel limpia y [place]. Use una toalla para limpiar suavemente la zona de la episiotoma o del Insurance account manager. Evite frotarse.   Lvese las manos antes y despus de Magazine features editor medicamento en la zona de la episiotoma o del desgarro.   Coloque unos 3 parches para hemorroides en el apsito sanitario. El hammamelis que contienen los parches para hemorroides ayudan a Optician, dispensing las molestias y la hinchazn.   Consiga una botella perineal para aplicar agua tibia en la zona del perineo cuando orine. Squela dando golpecitos.   Aplique un aerosol con anestesia en la zona de la episiotoma o del desgarro segn las indicaciones del mdico. Esto ayuda a Optician, dispensing las [place].   Sintese en un anilo o almohada inflable para estar ms cmoda.   Slo tome medicamentos de venta libre o recetados para Primary school teacher, las molestias o Publishing copy la fiebre segn las indicaciones de su mdico.   No tenga relaciones sexuales ni use tampones hasta que el mdico la autorice. En general, debe esperarse al menos 6 semanas.   Cumpla con todos los controles del postparto.  SOLICITE ATENCIN MDICA SI:   Necesita analgsicos ms fuertes.   Siente dolor al [employer].  SOLICITE ATENCIN MDICA DE INMEDIATO SI:   Presenta enrojecimiento, hinchazn o aumento del dolor en la zona de la episiotoma o Art therapist.   Observa que hay pus en la zona.   Tiene fiebre.   Advierte un olor ftido en la episiotoma o Art therapist.   La episiotoma o Art therapist se abren.   Nota que la hinchazn en la episiotoma es mayor que cuando dej el hospital.   No puede orinar.  ASEGRESE DE QUE:   Comprende estas instrucciones.   Controlar su enfermedad.   Solicitar ayuda de inmediato si no mejora o si empeora.  Document Released: 03/08/2005 Document Revised: 05/18/2011 [facility] Patient  Information 2012 [place], [place].

## 2011-12-12 ENCOUNTER — Encounter (HOSPITAL_COMMUNITY): Payer: Self-pay | Admitting: Advanced Practice Midwife

## 2011-12-12 ENCOUNTER — Inpatient Hospital Stay (HOSPITAL_COMMUNITY): Admission: RE | Admit: 2011-12-12 | Payer: Medicaid Other | Source: Ambulatory Visit

## 2011-12-19 ENCOUNTER — Inpatient Hospital Stay (HOSPITAL_COMMUNITY)
Admission: AD | Admit: 2011-12-19 | Discharge: 2011-12-19 | Disposition: A | Discharge status: Home / Self Care | Payer: Medicaid Other | Source: Ambulatory Visit | Attending: Obstetrics | Admitting: Obstetrics

## 2011-12-19 DIAGNOSIS — R339 Retention of urine, unspecified: Secondary | ICD-10-CM | POA: Insufficient documentation

## 2011-12-19 DIAGNOSIS — O99893 Other specified diseases and conditions complicating puerperium: Secondary | ICD-10-CM | POA: Insufficient documentation

## 2011-12-19 NOTE — Progress Notes (Signed)
Dr Clearance Coots notified of patient, foley catheter and leg bag placed. Order to discharge patient. Outpatient foley placement. Patient is aware to return to office for removal.

## 2011-12-19 NOTE — MAU Note (Signed)
Patient states she had a vaginal delivery on 6-28 and had difficulty urinating until 6-30 and had to be cathed. Has continued to have difficulty completely emptying her bladder and has no urge to urinate. Was seen in the office today and had 800cc retained urine after urinating and was sent to MAU for a foley with leg bag.

## 2013-10-13 ENCOUNTER — Other Ambulatory Visit: Payer: Self-pay | Admitting: Physician Assistant

## 2013-10-13 ENCOUNTER — Other Ambulatory Visit (HOSPITAL_COMMUNITY)
Admission: RE | Admit: 2013-10-13 | Discharge: 2013-10-13 | Disposition: A | Discharge status: Home / Self Care | Payer: BC Managed Care – PPO | Source: Ambulatory Visit | Attending: Family Medicine | Admitting: Family Medicine

## 2013-10-13 DIAGNOSIS — Z124 Encounter for screening for malignant neoplasm of cervix: Secondary | ICD-10-CM | POA: Insufficient documentation

## 2014-04-13 ENCOUNTER — Encounter (HOSPITAL_COMMUNITY): Payer: Self-pay | Admitting: Advanced Practice Midwife

## 2015-12-10 ENCOUNTER — Ambulatory Visit (INDEPENDENT_AMBULATORY_CARE_PROVIDER_SITE_OTHER): Payer: Medicaid Other | Admitting: Certified Nurse Midwife

## 2015-12-10 ENCOUNTER — Encounter: Payer: Self-pay | Admitting: Certified Nurse Midwife

## 2015-12-10 VITALS — BP 117/77 | HR 70 | Wt 163.0 lb

## 2015-12-10 DIAGNOSIS — Z1389 Encounter for screening for other disorder: Secondary | ICD-10-CM | POA: Diagnosis not present

## 2015-12-10 DIAGNOSIS — O219 Vomiting of pregnancy, unspecified: Secondary | ICD-10-CM

## 2015-12-10 DIAGNOSIS — Z3480 Encounter for supervision of other normal pregnancy, unspecified trimester: Secondary | ICD-10-CM | POA: Insufficient documentation

## 2015-12-10 DIAGNOSIS — Z3481 Encounter for supervision of other normal pregnancy, first trimester: Secondary | ICD-10-CM

## 2015-12-10 DIAGNOSIS — Z3490 Encounter for supervision of normal pregnancy, unspecified, unspecified trimester: Secondary | ICD-10-CM

## 2015-12-10 DIAGNOSIS — Z3687 Encounter for antenatal screening for uncertain dates: Secondary | ICD-10-CM

## 2015-12-10 DIAGNOSIS — Z331 Pregnant state, incidental: Secondary | ICD-10-CM | POA: Diagnosis not present

## 2015-12-10 LAB — POCT URINALYSIS DIPSTICK
Bilirubin, UA: NEGATIVE
Blood, UA: NEGATIVE
Glucose, UA: NEGATIVE
Ketones, UA: NEGATIVE
Leukocytes, UA: NEGATIVE
Nitrite, UA: NEGATIVE
SPEC GRAV UA: 1.025
UROBILINOGEN UA: NEGATIVE
pH, UA: 6.5

## 2015-12-10 MED ORDER — DOXYLAMINE-PYRIDOXINE 10-10 MG PO TBEC: DELAYED_RELEASE_TABLET | ORAL | Status: DC

## 2015-12-10 MED ORDER — VITAFOL GUMMIES 3.33-0.333-34.8 MG PO CHEW: 3.0000 | CHEWABLE_TABLET | Freq: Every day | ORAL | Status: DC

## 2015-12-10 NOTE — Progress Notes (Signed)
Subjective:    Colleen Villegas is being seen today for her first obstetrical visit.  This is a planned pregnancy. She is at 758w6d gestation. Her obstetrical history is significant for none. Relationship with FOB: spouse, living together. Patient does intend to breast feed. Pregnancy history fully reviewed.  The information documented in the HPI was reviewed and verified.  Menstrual History: OB History    Gravida Para Term Preterm AB TAB SAB Ectopic Multiple Living   2 1 1  0 0 0 0 0 0 1     Vaginal, SROM.    Menarche age: 25 years of age.    Patient's last menstrual period was 09/25/2015 (lmp unknown).    Past Medical History  Diagnosis Date  . No pertinent past medical history     Past Surgical History  Procedure Laterality Date  . No past surgeries       (Not in a hospital admission) No Known Allergies  Social History  Substance Use Topics  . Smoking status: Never Smoker   . Smokeless tobacco: Never Used  . Alcohol Use: No    Family History  Problem Relation Age of Onset  . Anesthesia problems Neg Hx   . Diabetes Paternal Aunt   . Hypertension Paternal Grandmother      Review of Systems Constitutional: negative for weight loss Gastrointestinal: + for nausea & vomiting Genitourinary:negative for genital lesions and vaginal discharge and dysuria Musculoskeletal:negative for back pain Behavioral/Psych: negative for abusive relationship, depression, illegal drug usage and tobacco use    Objective:    BP 117/77 mmHg  Pulse 70  Wt 163 lb (73.936 kg)  LMP 09/25/2015 (LMP Unknown) General Appearance:    Alert, cooperative, no distress, appears stated age  Head:    Normocephalic, without obvious abnormality, atraumatic  Eyes:    PERRL, conjunctiva/corneas clear, EOM's intact, fundi    benign, both eyes  Ears:    Normal TM's and external ear canals, both ears  Nose:   Nares normal, septum midline, mucosa normal, no drainage    or sinus tenderness  Throat:   Lips,  mucosa, and tongue normal; teeth and gums normal  Neck:   Supple, symmetrical, trachea midline, no adenopathy;    thyroid:  no enlargement/tenderness/nodules; no carotid   bruit or JVD  Back:     Symmetric, no curvature, ROM normal, no CVA tenderness  Lungs:     Clear to auscultation bilaterally, respirations unlabored  Chest Wall:    No tenderness or deformity   Heart:    Regular rate and rhythm, S1 and S2 normal, no murmur, rub   or gallop  Breast Exam:    No tenderness, masses, or nipple abnormality  Abdomen:     Soft, non-tender, bowel sounds active all four quadrants,    no masses, no organomegaly  Genitalia:    Normal female without lesion, discharge or tenderness  Extremities:   Extremities normal, atraumatic, no cyanosis or edema  Pulses:   2+ and symmetric all extremities  Skin:   Skin color, texture, turgor normal, no rashes or lesions  Lymph nodes:   Cervical, supraclavicular, and axillary nodes normal  Neurologic:   CNII-XII intact, normal strength, sensation and reflexes    throughout         Cervix:   Long, thick, closed and posterior.  FHR: seen with bedside US.       Lab Review Urine pregnancy test Labs reviewed yes Radiologic studies reviewed no Assessment:    Pregnancy at  6037w6d weeks   N&V in early pregnancy  Unsure of LMP  Plan:    Prenatal vitamins.  Counseling provided regarding continued use of seat belts, cessation of alcohol consumption, smoking or use of illicit drugs; infection precautions i.e., influenza/TDAP immunizations, toxoplasmosis,CMV, parvovirus, listeria and varicella; workplace safety, exercise during pregnancy; routine dental care, safe medications, sexual activity, hot tubs, saunas, pools, travel, caffeine use, fish and methlymercury, potential toxins, hair treatments, varicose veins Weight gain recommendations per IOM guidelines reviewed: underweight/BMI< 18.5--> gain 28 - 40 lbs; normal weight/BMI 18.5 - 24.9--> gain 25 - 35 lbs;  overweight/BMI 25 - 29.9--> gain 15 - 25 lbs; obese/BMI >30->gain  11 - 20 lbs Problem list reviewed and updated. FIRST/CF mutation testing/NIPT/QUAD SCREEN/fragile X/Ashkenazi Jewish population testing/Spinal muscular atrophy discussed: declined. Role of ultrasound in pregnancy discussed; fetal survey: requested. Amniocentesis discussed: not indicated. VBAC calculator score: VBAC consent form provided Meds ordered this encounter  Medications  . Prenatal Vit-Fe Fumarate-FA (PRENATAL FA PO)    Sig: Take by mouth daily.  . Doxylamine-Pyridoxine (DICLEGIS) 10-10 MG TBEC    Sig: Take 1 tablet with breakfast and lunch.  Take 2 tablets at bedtime.    Dispense:  100 tablet    Refill:  4  . Prenatal Vit-Fe Phos-FA-Omega (VITAFOL GUMMIES) 3.33-0.333-34.8 MG CHEW    Sig: Chew 3 tablets by mouth daily.    Dispense:  90 tablet    Refill:  12   Orders Placed This Encounter  Procedures  . Culture, OB Urine  . US OB Comp Less 14 Wks    Standing Status: Future     Number of Occurrences:      Standing Expiration Date: 02/08/2017    Order Specific Question:  Reason for Exam (SYMPTOM  OR DIAGNOSIS REQUIRED)    Answer:  dating    Order Specific Question:  Preferred imaging location?    Answer:  Internal  . TSH  . HIV antibody  . Hemoglobinopathy evaluation  . Varicella zoster antibody, IgG  . Prenatal Profile I  . POCT urinalysis dipstick    Follow up in 4 weeks. 50% of 30 min visit spent on counseling and coordination of care.

## 2015-12-12 LAB — URINE CULTURE, OB REFLEX

## 2015-12-12 LAB — CULTURE, OB URINE

## 2015-12-13 LAB — PRENATAL PROFILE I(LABCORP)
ANTIBODY SCREEN: NEGATIVE
BASOS ABS: 0 10*3/uL (ref 0.0–0.2)
Basos: 0 %
EOS (ABSOLUTE): 0.1 10*3/uL (ref 0.0–0.4)
Eos: 1 %
HEMOGLOBIN: 13.1 g/dL (ref 11.1–15.9)
HEP B S AG: NEGATIVE
Hematocrit: 39.6 % (ref 34.0–46.6)
IMMATURE GRANULOCYTES: 0 %
Immature Grans (Abs): 0 10*3/uL (ref 0.0–0.1)
LYMPHS ABS: 2.6 10*3/uL (ref 0.7–3.1)
Lymphs: 33 %
MCH: 27.6 pg (ref 26.6–33.0)
MCHC: 33.1 g/dL (ref 31.5–35.7)
MCV: 83 fL (ref 79–97)
MONOS ABS: 0.6 10*3/uL (ref 0.1–0.9)
Monocytes: 8 %
NEUTROS PCT: 58 %
Neutrophils Absolute: 4.8 10*3/uL (ref 1.4–7.0)
PLATELETS: 228 10*3/uL (ref 150–379)
RBC: 4.75 x10E6/uL (ref 3.77–5.28)
RDW: 14.3 % (ref 12.3–15.4)
RPR Ser Ql: NONREACTIVE
Rh Factor: POSITIVE
Rubella Antibodies, IGG: 3.09 index (ref >0.99)
WBC: 8.1 10*3/uL (ref 3.4–10.8)

## 2015-12-13 LAB — HEMOGLOBINOPATHY EVALUATION
HGB A: 97.5 % (ref 94.0–98.0)
HGB C: 0 %
HGB S: 0 %
Hemoglobin A2 Quantitation: 2.5 % (ref 0.7–3.1)
Hemoglobin F Quantitation: 0 % (ref 0.0–2.0)

## 2015-12-13 LAB — PAP IG W/ RFLX HPV ASCU: PAP SMEAR COMMENT: 0

## 2015-12-13 LAB — VARICELLA ZOSTER ANTIBODY, IGG: VARICELLA: 436 {index} (ref >165)

## 2015-12-13 LAB — HIV ANTIBODY (ROUTINE TESTING W REFLEX): HIV Screen 4th Generation wRfx: NONREACTIVE

## 2015-12-13 LAB — TSH: TSH: 1.14 u[IU]/mL (ref 0.450–4.500)

## 2015-12-14 LAB — NUSWAB VG+, CANDIDA 6SP
CANDIDA GLABRATA, NAA: NEGATIVE
CANDIDA PARAPSILOSIS, NAA: NEGATIVE
CANDIDA TROPICALIS, NAA: NEGATIVE
CHLAMYDIA TRACHOMATIS, NAA: NEGATIVE
Candida albicans, NAA: POSITIVE — AB
Candida krusei, NAA: NEGATIVE
Candida lusitaniae, NAA: NEGATIVE
NEISSERIA GONORRHOEAE, NAA: NEGATIVE
Trich vag by NAA: NEGATIVE

## 2015-12-15 ENCOUNTER — Other Ambulatory Visit (HOSPITAL_COMMUNITY): Payer: Self-pay | Admitting: Certified Nurse Midwife

## 2015-12-15 DIAGNOSIS — B3731 Acute candidiasis of vulva and vagina: Secondary | ICD-10-CM

## 2015-12-15 DIAGNOSIS — B373 Candidiasis of vulva and vagina: Secondary | ICD-10-CM

## 2015-12-15 MED ORDER — TERCONAZOLE 0.8 % VA CREA: 1.0000 | TOPICAL_CREAM | Freq: Every day | VAGINAL | Status: DC

## 2015-12-30 ENCOUNTER — Other Ambulatory Visit: Payer: Self-pay | Admitting: Certified Nurse Midwife

## 2015-12-30 ENCOUNTER — Ambulatory Visit (INDEPENDENT_AMBULATORY_CARE_PROVIDER_SITE_OTHER): Payer: Medicaid Other

## 2015-12-30 DIAGNOSIS — O3680X1 Pregnancy with inconclusive fetal viability, fetus 1: Secondary | ICD-10-CM

## 2015-12-30 DIAGNOSIS — Z3687 Encounter for antenatal screening for uncertain dates: Secondary | ICD-10-CM

## 2016-01-05 ENCOUNTER — Telehealth: Payer: Self-pay | Admitting: *Deleted

## 2016-01-05 ENCOUNTER — Ambulatory Visit (INDEPENDENT_AMBULATORY_CARE_PROVIDER_SITE_OTHER): Payer: Medicaid Other | Admitting: Certified Nurse Midwife

## 2016-01-05 VITALS — BP 109/70 | HR 66 | Temp 98.1°F | Wt 163.0 lb

## 2016-01-05 DIAGNOSIS — Z331 Pregnant state, incidental: Secondary | ICD-10-CM

## 2016-01-05 DIAGNOSIS — Z1389 Encounter for screening for other disorder: Secondary | ICD-10-CM | POA: Diagnosis not present

## 2016-01-05 DIAGNOSIS — Z3482 Encounter for supervision of other normal pregnancy, second trimester: Secondary | ICD-10-CM

## 2016-01-05 LAB — POCT URINALYSIS DIPSTICK
Bilirubin, UA: NEGATIVE
Blood, UA: NEGATIVE
Glucose, UA: NEGATIVE
KETONES UA: NEGATIVE
Leukocytes, UA: NEGATIVE
Nitrite, UA: NEGATIVE
PH UA: 6.5
PROTEIN UA: NEGATIVE
SPEC GRAV UA: 1.015
UROBILINOGEN UA: NEGATIVE

## 2016-01-05 NOTE — Addendum Note (Signed)
Addended by: Marya Landry D on: 01/05/2016 02:19 PM   Modules accepted: Orders

## 2016-01-05 NOTE — Patient Instructions (Addendum)
Second Trimester of Pregnancy The second trimester is from week 13 through week 28, months 4 through 6. The second trimester is often a time when you feel your best. Your body has also adjusted to being pregnant, and you begin to feel better physically. Usually, morning sickness has lessened or quit completely, you may have more energy, and you may have an increase in appetite. The second trimester is also a time when the fetus is growing rapidly. At the end of the sixth month, the fetus is about 9 inches long and weighs about 1 pounds. You will likely begin to feel the baby move (quickening) between 18 and 20 weeks of the pregnancy. BODY CHANGES Your body goes through many changes during pregnancy. The changes vary from woman to woman.   Your weight will continue to increase. You will notice your lower abdomen bulging out.  You may begin to get stretch marks on your hips, abdomen, and breasts.  You may develop headaches that can be relieved by medicines approved by your health care provider.  You may urinate more often because the fetus is pressing on your bladder.  You may develop or continue to have heartburn as a result of your pregnancy.  You may develop constipation because certain hormones are causing the muscles that push waste through your intestines to slow down.  You may develop hemorrhoids or swollen, bulging veins (varicose veins).  You may have back pain because of the weight gain and pregnancy hormones relaxing your joints between the bones in your pelvis and as a result of a shift in weight and the muscles that support your balance.  Your breasts will continue to grow and be tender.  Your gums may bleed and may be sensitive to brushing and flossing.  Dark spots or blotches (chloasma, mask of pregnancy) may develop on your face. This will likely fade after the baby is born.  A dark line from your belly button to the pubic area (linea nigra) may appear. This will likely fade  after the baby is born.  You may have changes in your hair. These can include thickening of your hair, rapid growth, and changes in texture. Some women also have hair loss during or after pregnancy, or hair that feels dry or thin. Your hair will most likely return to normal after your baby is born. WHAT TO EXPECT AT YOUR PRENATAL VISITS During a routine prenatal visit:  You will be weighed to make sure you and the fetus are growing normally.  Your blood pressure will be taken.  Your abdomen will be measured to track your baby's growth.  The fetal heartbeat will be listened to.  Any test results from the previous visit will be discussed. Your health care provider may ask you:  How you are feeling.  If you are feeling the baby move.  If you have had any abnormal symptoms, such as leaking fluid, bleeding, severe headaches, or abdominal cramping.  If you are using any tobacco products, including cigarettes, chewing tobacco, and electronic cigarettes.  If you have any questions. Other tests that may be performed during your second trimester include:  Blood tests that check for:  Low iron levels (anemia).  Gestational diabetes (between 24 and 28 weeks).  Rh antibodies.  Urine tests to check for infections, diabetes, or protein in the urine.  An ultrasound to confirm the proper growth and development of the baby.  An amniocentesis to check for possible genetic problems.  Fetal screens for spina bifida   and Down syndrome.  HIV (human immunodeficiency virus) testing. Routine prenatal testing includes screening for HIV, unless you choose not to have this test. HOME CARE INSTRUCTIONS   Avoid all smoking, herbs, alcohol, and unprescribed drugs. These chemicals affect the formation and growth of the baby.  Do not use any tobacco products, including cigarettes, chewing tobacco, and electronic cigarettes. If you need help quitting, ask your health care provider. You may receive  counseling support and other resources to help you quit.  Follow your health care provider's instructions regarding medicine use. There are medicines that are either safe or unsafe to take during pregnancy.  Exercise only as directed by your health care provider. Experiencing uterine cramps is a good sign to stop exercising.  Continue to eat regular, healthy meals.  Wear a good support bra for breast tenderness.  Do not use hot tubs, steam rooms, or saunas.  Wear your seat belt at all times when driving.  Avoid raw meat, uncooked cheese, cat litter boxes, and soil used by cats. These carry germs that can cause birth defects in the baby.  Take your prenatal vitamins.  Take 1500-2000 mg of calcium daily starting at the 20th week of pregnancy until you deliver your baby.  Try taking a stool softener (if your health care provider approves) if you develop constipation. Eat more high-fiber foods, such as fresh vegetables or fruit and whole grains. Drink plenty of fluids to keep your urine clear or pale yellow.  Take warm sitz baths to soothe any pain or discomfort caused by hemorrhoids. Use hemorrhoid cream if your health care provider approves.  If you develop varicose veins, wear support hose. Elevate your feet for 15 minutes, 3-4 times a day. Limit salt in your diet.  Avoid heavy lifting, wear low heel shoes, and practice good posture.  Rest with your legs elevated if you have leg cramps or low back pain.  Visit your dentist if you have not gone yet during your pregnancy. Use a soft toothbrush to brush your teeth and be gentle when you floss.  A sexual relationship may be continued unless your health care provider directs you otherwise.  Continue to go to all your prenatal visits as directed by your health care provider. SEEK MEDICAL CARE IF:   You have dizziness.  You have mild pelvic cramps, pelvic pressure, or nagging pain in the abdominal area.  You have persistent nausea,  vomiting, or diarrhea.  You have a bad smelling vaginal discharge.  You have pain with urination. SEEK IMMEDIATE MEDICAL CARE IF:   You have a fever.  You are leaking fluid from your vagina.  You have spotting or bleeding from your vagina.  You have severe abdominal cramping or pain.  You have rapid weight gain or loss.  You have shortness of breath with chest pain.  You notice sudden or extreme swelling of your face, hands, ankles, feet, or legs.  You have not felt your baby move in over an hour.  You have severe headaches that do not go away with medicine.  You have vision changes.   This information is not intended to replace advice given to you by your health care provider. Make sure you discuss any questions you have with your health care provider.   Document Released: 05/23/2001 Document Revised: 06/19/2014 Document Reviewed: 07/30/2012 Elsevier Interactive Patient Education 2016 [employer] Hyperemesis Gravidarum Hyperemesis gravidarum is a severe form of nausea and vomiting that happens during pregnancy. Hyperemesis is worse than morning sickness.  It may cause you to have nausea or vomiting all day for many days. It may keep you from eating and drinking enough food and liquids. Hyperemesis usually occurs during the first half (the first 20 weeks) of pregnancy. It often goes away once a woman is in her second half of pregnancy. However, sometimes hyperemesis continues through an entire pregnancy.  CAUSES  The cause of this condition is not completely known but is thought to be related to changes in the body's hormones when pregnant. It could be from the high level of the pregnancy hormone or an increase in estrogen in the body.  SIGNS AND SYMPTOMS   Severe nausea and vomiting.  Nausea that does not go away.  Vomiting that does not allow you to keep any food down.  Weight loss and body fluid loss (dehydration).  Having no desire to eat or not liking food you have  previously enjoyed. DIAGNOSIS  Your health care provider will do a physical exam and ask you about your symptoms. He or she may also order blood tests and urine tests to make sure something else is not causing the problem.  TREATMENT  You may only need medicine to control the problem. If medicines do not control the nausea and vomiting, you will be treated in the hospital to prevent dehydration, increased acid in the blood (acidosis), weight loss, and changes in the electrolytes in your body that may harm the unborn baby (fetus). You may need IV fluids.  HOME CARE INSTRUCTIONS   Only take over-the-counter or prescription medicines as directed by your health care provider.  Try eating a couple of dry crackers or toast in the morning before getting out of bed.  Avoid foods and smells that upset your stomach.  Avoid fatty and spicy foods.  Eat 5-6 small meals a day.  Do not drink when eating meals. Drink between meals.  For snacks, eat high-protein foods, such as cheese.  Eat or suck on things that have ginger in them. Ginger helps nausea.  Avoid food preparation. The smell of food can spoil your appetite.  Avoid iron pills and iron in your multivitamins until after 3-4 months of being pregnant. However, consult with your health care provider before stopping any prescribed iron pills. SEEK MEDICAL CARE IF:   Your abdominal pain increases.  You have a severe headache.  You have vision problems.  You are losing weight. SEEK IMMEDIATE MEDICAL CARE IF:   You are unable to keep fluids down.  You vomit blood.  You have constant nausea and vomiting.  You have excessive weakness.  You have extreme thirst.  You have dizziness or fainting.  You have a fever or persistent symptoms for more than 2-3 days.  You have a fever and your symptoms suddenly get worse. MAKE SURE YOU:   Understand these instructions.  Will watch your condition.  Will get help right away if you are  not doing well or get worse.   This information is not intended to replace advice given to you by your health care provider. Make sure you discuss any questions you have with your health care provider.   Document Released: 05/29/2005 Document Revised: 03/19/2013 Document Reviewed: 01/08/2013 Elsevier Interactive Patient Education [employer]

## 2016-01-05 NOTE — Progress Notes (Signed)
  Subjective:    Colleen Villegas is a 25 y.o. female being seen today for her obstetrical visit. She is at [redacted]w[redacted]d gestation. Patient reports: headache, nausea, no bleeding, no contractions, no cramping, no leaking and vomiting.  States that Tylenol helps with her HA, discussed proper dosing.  Declines Zofran today and states that the Diclegis is helping with the N&V.    Problem List Items Addressed This Visit    None    Visit Diagnoses    Encounter for supervision of other normal pregnancy in second trimester    -  Primary   Relevant Orders   MaterniT21 PLUS Core+SCA   US OB Comp + 14 Wk     Patient Active Problem List   Diagnosis Date Noted  . Encounter for supervision of other normal pregnancy in first trimester 12/10/2015    Objective:     BP 109/70   Pulse 66   Temp 98.1 F (36.7 C)   Wt 163 lb (73.9 kg)   LMP 09/25/2015 (LMP Unknown)   BMI 29.81 kg/m  Uterine Size: Below umbilicus   FHR: 150 by Doppler  Assessment:    Pregnancy @ [redacted]w[redacted]d  weeks Doing well    N&V in early pregnancy  HA  Plan:    Problem list reviewed and updated. Labs reviewed.  Follow up in 4 weeks. FIRST/CF mutation testing/NIPT/QUAD SCREEN/fragile X/Ashkenazi Jewish population testing/Spinal muscular atrophy discussed: ordered. Role of ultrasound in pregnancy discussed; fetal survey: ordered. Amniocentesis discussed: not indicated. 50% of 15 minute visit spent on counseling and coordination of care.

## 2016-01-05 NOTE — Progress Notes (Signed)
Patient reports she is doing well 

## 2016-01-06 ENCOUNTER — Telehealth: Payer: Self-pay | Admitting: *Deleted

## 2016-01-13 LAB — MATERNIT21 PLUS CORE+SCA
Chromosome 13: NEGATIVE
Chromosome 18: NEGATIVE
Chromosome 21: NEGATIVE
PDF: 0
Y CHROMOSOME: NOT DETECTED

## 2016-01-17 ENCOUNTER — Other Ambulatory Visit: Payer: Self-pay | Admitting: Certified Nurse Midwife

## 2016-01-20 NOTE — Telephone Encounter (Signed)
Patient is aware of lab results and medication sent to pharmacy.

## 2016-01-20 NOTE — Telephone Encounter (Signed)
Patient is aware of lab results and how to take medication called to pharmacy.

## 2016-01-26 ENCOUNTER — Telehealth: Payer: Self-pay | Admitting: *Deleted

## 2016-01-26 NOTE — Telephone Encounter (Signed)
Patient called because she had some blood in her emesis in a small amount.States has been vomiting every morning and is on Diclegis.Explained to patient that this is caused by irritation to her throat,this is not unusual but to call office if this get worse.or she gets more concerned.

## 2016-02-03 ENCOUNTER — Ambulatory Visit (INDEPENDENT_AMBULATORY_CARE_PROVIDER_SITE_OTHER): Payer: Medicaid Other | Admitting: Certified Nurse Midwife

## 2016-02-03 ENCOUNTER — Encounter: Payer: Self-pay | Admitting: Certified Nurse Midwife

## 2016-02-03 VITALS — BP 120/72 | HR 93 | Wt 163.2 lb

## 2016-02-03 DIAGNOSIS — Z1389 Encounter for screening for other disorder: Secondary | ICD-10-CM | POA: Diagnosis not present

## 2016-02-03 DIAGNOSIS — Z331 Pregnant state, incidental: Secondary | ICD-10-CM

## 2016-02-03 DIAGNOSIS — Z3481 Encounter for supervision of other normal pregnancy, first trimester: Secondary | ICD-10-CM

## 2016-02-03 DIAGNOSIS — Z3482 Encounter for supervision of other normal pregnancy, second trimester: Secondary | ICD-10-CM

## 2016-02-03 LAB — POCT URINALYSIS DIPSTICK
BILIRUBIN UA: NEGATIVE
GLUCOSE UA: NEGATIVE
Leukocytes, UA: NEGATIVE
Nitrite, UA: NEGATIVE
RBC UA: NEGATIVE
SPEC GRAV UA: 1.02
UROBILINOGEN UA: NEGATIVE
pH, UA: 6

## 2016-02-03 NOTE — Patient Instructions (Addendum)
How a Baby Grows During Pregnancy Pregnancy begins when a female's sperm enters a female's egg (fertilization). This happens in one of the tubes (fallopian tubes) that connect the ovaries to the womb (uterus). The fertilized egg is called an embryo until it reaches 10 weeks. From 10 weeks until birth, it is called a fetus. The fertilized egg moves down the fallopian tube to the uterus. Then it implants into the lining of the uterus and begins to grow. The developing fetus receives oxygen and nutrients through the pregnant woman's bloodstream and the tissues that grow (placenta) to support the fetus. The placenta is the life support system for the fetus. It provides nutrition and removes waste. Learning as much as you can about your pregnancy and how your baby is developing can help you enjoy the experience. It can also make you aware of when there might be a problem and when to ask questions. HOW LONG DOES A TYPICAL PREGNANCY LAST? A pregnancy usually lasts 280 days, or about 40 weeks. Pregnancy is divided into three trimesters:  First trimester: 0-13 weeks.  Second trimester: 14-27 weeks.  Third trimester: 28-40 weeks. The day when your baby is considered ready to be born (full term) is your estimated date of delivery. HOW DOES MY BABY DEVELOP MONTH BY MONTH? First month  The fertilized egg attaches to the inside of the uterus.  Some cells will form the placenta. Others will form the fetus.  The arms, legs, brain, spinal cord, lungs, and heart begin to develop.  At the end of the first month, the heart begins to beat. Second month  The bones, inner ear, eyelids, hands, and feet form.  The genitals develop.  By the end of 8 weeks, all major organs are developing. Third month  All of the internal organs are forming.  Teeth develop below the gums.  Bones and muscles begin to grow. The spine can flex.  The skin is transparent.  Fingernails and toenails begin to form.  Arms and  legs continue to grow longer, and hands and feet develop.  The fetus is about 3 in (7.6 cm) long. Fourth month  The placenta is completely formed.  The external sex organs, neck, outer ear, eyebrows, eyelids, and fingernails are formed.  The fetus can hear, swallow, and move its arms and legs.  The kidneys begin to produce urine.  The skin is covered with a white waxy coating (vernix) and very fine hair (lanugo). Fifth month  The fetus moves around more and can be felt for the first time (quickening).  The fetus starts to sleep and wake up and may begin to suck its finger.  The nails grow to the end of the fingers.  The organ in the digestive system that makes bile (gallbladder) functions and helps to digest the nutrients.  If your baby is a girl, eggs are present in her ovaries. If your baby is a boy, testicles start to move down into his scrotum. Sixth month  The lungs are formed, but the fetus is not yet able to breathe.  The eyes open. The brain continues to develop.  Your baby has fingerprints and toe prints. Your baby's hair grows thicker.  At the end of the second trimester, the fetus is about 9 in (22.9 cm) long. Seventh month  The fetus kicks and stretches.  The eyes are developed enough to sense changes in light.  The hands can make a grasping motion.  The fetus responds to sound. Eighth month  All organs and body systems are fully developed and functioning.  Bones harden and taste buds develop. The fetus may hiccup.  Certain areas of the brain are still developing. The skull remains soft. Ninth month  The fetus gains about  lb (0.23 kg) each week.  The lungs are fully developed.  Patterns of sleep develop.  The fetus's head typically moves into a head-down position (vertex) in the uterus to prepare for birth. If the buttocks move into a vertex position instead, the baby is breech.  The fetus weighs 6-9 lbs (2.72-4.08 kg) and is 19-20 in  (48.26-50.8 cm) long. WHAT CAN I DO TO HAVE A HEALTHY PREGNANCY AND HELP MY BABY DEVELOP? Eating and Drinking  Eat a healthy diet.  Talk with your health care provider to make sure that you are getting the nutrients that you and your baby need.  Visit www.BuildDNA.es to learn about creating a healthy diet.  Gain a healthy amount of weight during pregnancy as advised by your health care provider. This is usually 25-35 pounds. You may need to:  Gain more if you were underweight before getting pregnant or if you are pregnant with more than one baby.  Gain less if you were overweight or obese when you got pregnant. Medicines and Vitamins  Take prenatal vitamins as directed by your health care provider. These include vitamins such as folic acid, iron, calcium, and vitamin D. They are important for healthy development.  Take medicines only as directed by your health care provider. Read labels and ask a pharmacist or your health care provider whether over-the-counter medicines, supplements, and prescription drugs are safe to take during pregnancy. Activities  Be physically active as advised by your health care provider. Ask your health care provider to recommend activities that are safe for you to do, such as walking or swimming.  Do not participate in strenuous or extreme sports. Lifestyle  Do not drink alcohol.  Do not use any tobacco products, including cigarettes, chewing tobacco, or electronic cigarettes. If you need help quitting, ask your health care provider.  Do not use illegal drugs. Safety  Avoid exposure to mercury, lead, or other heavy metals. Ask your health care provider about common sources of these heavy metals.  Avoid listeria infection during pregnancy. Follow these precautions:  Do not eat soft cheeses or deli meats.  Do not eat hot dogs unless they have been warmed up to the point of steaming, such as in the microwave oven.  Do not drink unpasteurized  milk.  Avoid toxoplasmosis infection during pregnancy. Follow these precautions:  Do not change your cat's litter box, if you have a cat. Ask someone else to do this for you.  Wear gardening gloves while working in the yard. General Instructions  Keep all follow-up visits as directed by your health care provider. This is important. This includes prenatal care and screening tests.  Manage any chronic health conditions. Work closely with your health care provider to keep conditions, such as diabetes, under control. HOW DO I KNOW IF MY BABY IS DEVELOPING WELL? At each prenatal visit, your health care provider will do several different tests to check on your health and keep track of your baby's development. These include:  Fundal height.  Your health care provider will measure your growing belly from top to bottom using a tape measure.  Your health care provider will also feel your belly to determine your baby's position.  Heartbeat.  An ultrasound in the first trimester can confirm  pregnancy and show a heartbeat, depending on how far along you are.  Your health care provider will check your baby's heart rate at every prenatal visit.  As you get closer to your delivery date, you may have regular fetal heart rate monitoring to make sure that your baby is not in distress.  Second trimester ultrasound.  This ultrasound checks your baby's development. It also indicates your baby's gender. WHAT SHOULD I DO IF I HAVE CONCERNS ABOUT MY BABY'S DEVELOPMENT? Always talk with your health care provider about any concerns that you may have.   This information is not intended to replace advice given to you by your health care provider. Make sure you discuss any questions you have with your health care provider.   Document Released: 11/15/2007 Document Revised: 02/17/2015 Document Reviewed: 11/05/2013 Elsevier Interactive Patient Education 2016 [employer] Second Trimester of Pregnancy The  second trimester is from week 13 through week 28, months 4 through 6. The second trimester is often a time when you feel your best. Your body has also adjusted to being pregnant, and you begin to feel better physically. Usually, morning sickness has lessened or quit completely, you may have more energy, and you may have an increase in appetite. The second trimester is also a time when the fetus is growing rapidly. At the end of the sixth month, the fetus is about 9 inches long and weighs about 1 pounds. You will likely begin to feel the baby move (quickening) between 18 and 20 weeks of the pregnancy. BODY CHANGES Your body goes through many changes during pregnancy. The changes vary from woman to woman.   Your weight will continue to increase. You will notice your lower abdomen bulging out.  You may begin to get stretch marks on your hips, abdomen, and breasts.  You may develop headaches that can be relieved by medicines approved by your health care provider.  You may urinate more often because the fetus is pressing on your bladder.  You may develop or continue to have heartburn as a result of your pregnancy.  You may develop constipation because certain hormones are causing the muscles that push waste through your intestines to slow down.  You may develop hemorrhoids or swollen, bulging veins (varicose veins).  You may have back pain because of the weight gain and pregnancy hormones relaxing your joints between the bones in your pelvis and as a result of a shift in weight and the muscles that support your balance.  Your breasts will continue to grow and be tender.  Your gums may bleed and may be sensitive to brushing and flossing.  Dark spots or blotches (chloasma, mask of pregnancy) may develop on your face. This will likely fade after the baby is born.  A dark line from your belly button to the pubic area (linea nigra) may appear. This will likely fade after the baby is born.  You may  have changes in your hair. These can include thickening of your hair, rapid growth, and changes in texture. Some women also have hair loss during or after pregnancy, or hair that feels dry or thin. Your hair will most likely return to normal after your baby is born. WHAT TO EXPECT AT YOUR PRENATAL VISITS During a routine prenatal visit:  You will be weighed to make sure you and the fetus are growing normally.  Your blood pressure will be taken.  Your abdomen will be measured to track your baby's growth.  The fetal heartbeat will  be listened to.  Any test results from the previous visit will be discussed. Your health care provider may ask you:  How you are feeling.  If you are feeling the baby move.  If you have had any abnormal symptoms, such as leaking fluid, bleeding, severe headaches, or abdominal cramping.  If you are using any tobacco products, including cigarettes, chewing tobacco, and electronic cigarettes.  If you have any questions. Other tests that may be performed during your second trimester include:  Blood tests that check for:  Low iron levels (anemia).  Gestational diabetes (between 24 and 28 weeks).  Rh antibodies.  Urine tests to check for infections, diabetes, or protein in the urine.  An ultrasound to confirm the proper growth and development of the baby.  An amniocentesis to check for possible genetic problems.  Fetal screens for spina bifida and Down syndrome.  HIV (human immunodeficiency virus) testing. Routine prenatal testing includes screening for HIV, unless you choose not to have this test. HOME CARE INSTRUCTIONS   Avoid all smoking, herbs, alcohol, and unprescribed drugs. These chemicals affect the formation and growth of the baby.  Do not use any tobacco products, including cigarettes, chewing tobacco, and electronic cigarettes. If you need help quitting, ask your health care provider. You may receive counseling support and other resources  to help you quit.  Follow your health care provider's instructions regarding medicine use. There are medicines that are either safe or unsafe to take during pregnancy.  Exercise only as directed by your health care provider. Experiencing uterine cramps is a good sign to stop exercising.  Continue to eat regular, healthy meals.  Wear a good support bra for breast tenderness.  Do not use hot tubs, steam rooms, or saunas.  Wear your seat belt at all times when driving.  Avoid raw meat, uncooked cheese, cat litter boxes, and soil used by cats. These carry germs that can cause birth defects in the baby.  Take your prenatal vitamins.  Take 1500-2000 mg of calcium daily starting at the 20th week of pregnancy until you deliver your baby.  Try taking a stool softener (if your health care provider approves) if you develop constipation. Eat more high-fiber foods, such as fresh vegetables or fruit and whole grains. Drink plenty of fluids to keep your urine clear or pale yellow.  Take warm sitz baths to soothe any pain or discomfort caused by hemorrhoids. Use hemorrhoid cream if your health care provider approves.  If you develop varicose veins, wear support hose. Elevate your feet for 15 minutes, 3-4 times a day. Limit salt in your diet.  Avoid heavy lifting, wear low heel shoes, and practice good posture.  Rest with your legs elevated if you have leg cramps or low back pain.  Visit your dentist if you have not gone yet during your pregnancy. Use a soft toothbrush to brush your teeth and be gentle when you floss.  A sexual relationship may be continued unless your health care provider directs you otherwise.  Continue to go to all your prenatal visits as directed by your health care provider. SEEK MEDICAL CARE IF:   You have dizziness.  You have mild pelvic cramps, pelvic pressure, or nagging pain in the abdominal area.  You have persistent nausea, vomiting, or diarrhea.  You have a bad  smelling vaginal discharge.  You have pain with urination. SEEK IMMEDIATE MEDICAL CARE IF:   You have a fever.  You are leaking fluid from your vagina.  You  have spotting or bleeding from your vagina.  You have severe abdominal cramping or pain.  You have rapid weight gain or loss.  You have shortness of breath with chest pain.  You notice sudden or extreme swelling of your face, hands, ankles, feet, or legs.  You have not felt your baby move in over an hour.  You have severe headaches that do not go away with medicine.  You have vision changes.   This information is not intended to replace advice given to you by your health care provider. Make sure you discuss any questions you have with your health care provider.   Document Released: 05/23/2001 Document Revised: 06/19/2014 Document Reviewed: 07/30/2012 Elsevier Interactive Patient Education 2016 [employer] Preterm Labor Information Preterm labor is when labor starts at less than 37 weeks of pregnancy. The normal length of a pregnancy is 39 to 41 weeks. CAUSES Often, there is no identifiable underlying cause as to why a woman goes into preterm labor. One of the most common known causes of preterm labor is infection. Infections of the uterus, cervix, vagina, amniotic sac, bladder, kidney, or even the lungs (pneumonia) can cause labor to start. Other suspected causes of preterm labor include:   Urogenital infections, such as yeast infections and bacterial vaginosis.   Uterine abnormalities (uterine shape, uterine septum, fibroids, or bleeding from the placenta).   A cervix that has been operated on (it may fail to stay closed).   Malformations in the fetus.   Multiple gestations (twins, triplets, and so on).   Breakage of the amniotic sac.  RISK FACTORS  Having a previous history of preterm labor.   Having premature rupture of membranes (PROM).   Having a placenta that covers the opening of the cervix  (placenta previa).   Having a placenta that separates from the uterus (placental abruption).   Having a cervix that is too weak to hold the fetus in the uterus (incompetent cervix).   Having too much fluid in the amniotic sac (polyhydramnios).   Taking illegal drugs or smoking while pregnant.   Not gaining enough weight while pregnant.   Being younger than 4818 and older than 24 years old.   Having a low socioeconomic status.   Being African American. SYMPTOMS Signs and symptoms of preterm labor include:   Menstrual-like cramps, abdominal pain, or back pain.  Uterine contractions that are regular, as frequent as six in an hour, regardless of their intensity (may be mild or painful).  Contractions that start on the top of the uterus and spread down to the lower abdomen and back.   A sense of increased pelvic pressure.   A watery or bloody mucus discharge that comes from the vagina.  TREATMENT Depending on the length of the pregnancy and other circumstances, your health care provider may suggest bed rest. If necessary, there are medicines that can be given to stop contractions and to mature the fetal lungs. If labor happens before 34 weeks of pregnancy, a prolonged hospital stay may be recommended. Treatment depends on the condition of both you and the fetus.  WHAT SHOULD YOU DO IF YOU THINK YOU ARE IN PRETERM LABOR? Call your health care provider right away. You will need to go to the hospital to get checked immediately. HOW CAN YOU PREVENT PRETERM LABOR IN FUTURE PREGNANCIES? You should:   Stop smoking if you smoke.  Maintain healthy weight gain and avoid chemicals and drugs that are not necessary.  Be watchful for any type of  infection.  Inform your health care provider if you have a known history of preterm labor.   This information is not intended to replace advice given to you by your health care provider. Make sure you discuss any questions you have with  your health care provider.   Document Released: 08/19/2003 Document Revised: 01/29/2013 Document Reviewed: 07/01/2012 Elsevier Interactive Patient Education [employer]

## 2016-02-03 NOTE — Addendum Note (Signed)
Addended by: Elby BeckPAUL, Simar Pothier F on: 02/03/2016 05:34 PM   Modules accepted: Orders

## 2016-02-03 NOTE — Progress Notes (Signed)
  Subjective:    Colleen CollegeBrenda J Villegas is a 25 y.o. female being seen today for her obstetrical visit. She is at 3841w2d gestation. Patient reports: backache, no bleeding, no contractions, no cramping and no leaking.  Problem List Items Addressed This Visit      Other   Encounter for supervision of other normal pregnancy in first trimester    Other Visit Diagnoses    Supervision of other normal pregnancy, antepartum, second trimester    -  Primary     Patient Active Problem List   Diagnosis Date Noted  . Encounter for supervision of other normal pregnancy in first trimester 12/10/2015    Objective:     BP 120/72   Pulse 93   Wt 163 lb 3.2 oz (74 kg)   LMP 09/25/2015 (LMP Unknown)   BMI 29.85 kg/m  Uterine Size: Below umbilicus   FHR: 135 by doppler  Assessment:    Pregnancy @ 5141w2d  weeks Doing well    Lumbar back pain of pregnancy  Plan:    Problem list reviewed and updated. Labs reviewed.  Follow up in 4 weeks. FIRST/CF mutation testing/NIPT/QUAD SCREEN/fragile X/Ashkenazi Jewish population testing/Spinal muscular atrophy discussed: results reviewed. Role of ultrasound in pregnancy discussed; fetal survey: ordered. Amniocentesis discussed: not indicated. 50% of 15 minute visit spent on counseling and coordination of care.

## 2016-02-03 NOTE — Progress Notes (Signed)
Pt c/o low back pain

## 2016-02-04 ENCOUNTER — Encounter: Payer: Medicaid Other | Admitting: Certified Nurse Midwife

## 2016-02-10 ENCOUNTER — Other Ambulatory Visit: Payer: Medicaid Other

## 2016-02-11 ENCOUNTER — Other Ambulatory Visit: Payer: Self-pay

## 2016-02-11 DIAGNOSIS — Z3490 Encounter for supervision of normal pregnancy, unspecified, unspecified trimester: Secondary | ICD-10-CM

## 2016-02-17 ENCOUNTER — Other Ambulatory Visit: Payer: Medicaid Other

## 2016-02-21 ENCOUNTER — Ambulatory Visit (HOSPITAL_COMMUNITY)
Admission: RE | Admit: 2016-02-21 | Discharge: 2016-02-21 | Disposition: A | Discharge status: Home / Self Care | Payer: Medicaid Other | Source: Ambulatory Visit | Attending: Certified Nurse Midwife | Admitting: Certified Nurse Midwife

## 2016-02-21 DIAGNOSIS — Z1389 Encounter for screening for other disorder: Secondary | ICD-10-CM

## 2016-02-21 DIAGNOSIS — Z3490 Encounter for supervision of normal pregnancy, unspecified, unspecified trimester: Secondary | ICD-10-CM

## 2016-02-21 DIAGNOSIS — Z3A19 19 weeks gestation of pregnancy: Secondary | ICD-10-CM

## 2016-02-21 DIAGNOSIS — Z36 Encounter for antenatal screening of mother: Secondary | ICD-10-CM | POA: Insufficient documentation

## 2016-02-21 DIAGNOSIS — Z363 Encounter for antenatal screening for malformations: Secondary | ICD-10-CM

## 2016-02-22 ENCOUNTER — Other Ambulatory Visit: Payer: Self-pay | Admitting: Certified Nurse Midwife

## 2016-02-22 DIAGNOSIS — Z3490 Encounter for supervision of normal pregnancy, unspecified, unspecified trimester: Secondary | ICD-10-CM

## 2016-02-22 DIAGNOSIS — Z1389 Encounter for screening for other disorder: Secondary | ICD-10-CM

## 2016-02-22 DIAGNOSIS — Z3A19 19 weeks gestation of pregnancy: Secondary | ICD-10-CM

## 2016-02-23 ENCOUNTER — Other Ambulatory Visit: Payer: Self-pay | Admitting: Certified Nurse Midwife

## 2016-02-23 DIAGNOSIS — Z3481 Encounter for supervision of other normal pregnancy, first trimester: Secondary | ICD-10-CM

## 2016-03-10 ENCOUNTER — Ambulatory Visit (INDEPENDENT_AMBULATORY_CARE_PROVIDER_SITE_OTHER): Payer: Medicaid Other | Admitting: Certified Nurse Midwife

## 2016-03-10 VITALS — BP 100/63 | HR 64 | Temp 98.6°F | Wt 166.6 lb

## 2016-03-10 DIAGNOSIS — Z23 Encounter for immunization: Secondary | ICD-10-CM

## 2016-03-10 DIAGNOSIS — Z3482 Encounter for supervision of other normal pregnancy, second trimester: Secondary | ICD-10-CM | POA: Diagnosis not present

## 2016-03-10 MED ORDER — LEVOCETIRIZINE DIHYDROCHLORIDE 5 MG PO TABS: 5.0000 mg | ORAL_TABLET | Freq: Every evening | ORAL | 4 refills | Status: DC

## 2016-03-10 MED ORDER — OB COMPLETE PETITE 35-5-1-200 MG PO CAPS: 1.0000 | ORAL_CAPSULE | Freq: Every day | ORAL | 12 refills | Status: DC

## 2016-03-10 NOTE — Progress Notes (Signed)
Patient would like a RX for allergy medicine.

## 2016-03-10 NOTE — Patient Instructions (Signed)
How a Baby Grows During Pregnancy Pregnancy begins when a female's sperm enters a female's egg (fertilization). This happens in one of the tubes (fallopian tubes) that connect the ovaries to the womb (uterus). The fertilized egg is called an embryo until it reaches 10 weeks. From 10 weeks until birth, it is called a fetus. The fertilized egg moves down the fallopian tube to the uterus. Then it implants into the lining of the uterus and begins to grow. The developing fetus receives oxygen and nutrients through the pregnant woman's bloodstream and the tissues that grow (placenta) to support the fetus. The placenta is the life support system for the fetus. It provides nutrition and removes waste. Learning as much as you can about your pregnancy and how your baby is developing can help you enjoy the experience. It can also make you aware of when there might be a problem and when to ask questions. HOW LONG DOES A TYPICAL PREGNANCY LAST? A pregnancy usually lasts 280 days, or about 40 weeks. Pregnancy is divided into three trimesters:  First trimester: 0-13 weeks.  Second trimester: 14-27 weeks.  Third trimester: 28-40 weeks. The day when your baby is considered ready to be born (full term) is your estimated date of delivery. HOW DOES MY BABY DEVELOP MONTH BY MONTH? First month  The fertilized egg attaches to the inside of the uterus.  Some cells will form the placenta. Others will form the fetus.  The arms, legs, brain, spinal cord, lungs, and heart begin to develop.  At the end of the first month, the heart begins to beat. Second month  The bones, inner ear, eyelids, hands, and feet form.  The genitals develop.  By the end of 8 weeks, all major organs are developing. Third month  All of the internal organs are forming.  Teeth develop below the gums.  Bones and muscles begin to grow. The spine can flex.  The skin is transparent.  Fingernails and toenails begin to form.  Arms and  legs continue to grow longer, and hands and feet develop.  The fetus is about 3 in (7.6 cm) long. Fourth month  The placenta is completely formed.  The external sex organs, neck, outer ear, eyebrows, eyelids, and fingernails are formed.  The fetus can hear, swallow, and move its arms and legs.  The kidneys begin to produce urine.  The skin is covered with a white waxy coating (vernix) and very fine hair (lanugo). Fifth month  The fetus moves around more and can be felt for the first time (quickening).  The fetus starts to sleep and wake up and may begin to suck its finger.  The nails grow to the end of the fingers.  The organ in the digestive system that makes bile (gallbladder) functions and helps to digest the nutrients.  If your baby is a girl, eggs are present in her ovaries. If your baby is a boy, testicles start to move down into his scrotum. Sixth month  The lungs are formed, but the fetus is not yet able to breathe.  The eyes open. The brain continues to develop.  Your baby has fingerprints and toe prints. Your baby's hair grows thicker.  At the end of the second trimester, the fetus is about 9 in (22.9 cm) long. Seventh month  The fetus kicks and stretches.  The eyes are developed enough to sense changes in light.  The hands can make a grasping motion.  The fetus responds to sound. Eighth month  All organs and body systems are fully developed and functioning.  Bones harden and taste buds develop. The fetus may hiccup.  Certain areas of the brain are still developing. The skull remains soft. Ninth month  The fetus gains about  lb (0.23 kg) each week.  The lungs are fully developed.  Patterns of sleep develop.  The fetus's head typically moves into a head-down position (vertex) in the uterus to prepare for birth. If the buttocks move into a vertex position instead, the baby is breech.  The fetus weighs 6-9 lbs (2.72-4.08 kg) and is 19-20 in  (48.26-50.8 cm) long. WHAT CAN I DO TO HAVE A HEALTHY PREGNANCY AND HELP MY BABY DEVELOP? Eating and Drinking  Eat a healthy diet.  Talk with your health care provider to make sure that you are getting the nutrients that you and your baby need.  Visit www.BuildDNA.es to learn about creating a healthy diet.  Gain a healthy amount of weight during pregnancy as advised by your health care provider. This is usually 25-35 pounds. You may need to:  Gain more if you were underweight before getting pregnant or if you are pregnant with more than one baby.  Gain less if you were overweight or obese when you got pregnant. Medicines and Vitamins  Take prenatal vitamins as directed by your health care provider. These include vitamins such as folic acid, iron, calcium, and vitamin D. They are important for healthy development.  Take medicines only as directed by your health care provider. Read labels and ask a pharmacist or your health care provider whether over-the-counter medicines, supplements, and prescription drugs are safe to take during pregnancy. Activities  Be physically active as advised by your health care provider. Ask your health care provider to recommend activities that are safe for you to do, such as walking or swimming.  Do not participate in strenuous or extreme sports. Lifestyle  Do not drink alcohol.  Do not use any tobacco products, including cigarettes, chewing tobacco, or electronic cigarettes. If you need help quitting, ask your health care provider.  Do not use illegal drugs. Safety  Avoid exposure to mercury, lead, or other heavy metals. Ask your health care provider about common sources of these heavy metals.  Avoid listeria infection during pregnancy. Follow these precautions:  Do not eat soft cheeses or deli meats.  Do not eat hot dogs unless they have been warmed up to the point of steaming, such as in the microwave oven.  Do not drink unpasteurized  milk.  Avoid toxoplasmosis infection during pregnancy. Follow these precautions:  Do not change your cat's litter box, if you have a cat. Ask someone else to do this for you.  Wear gardening gloves while working in the yard. General Instructions  Keep all follow-up visits as directed by your health care provider. This is important. This includes prenatal care and screening tests.  Manage any chronic health conditions. Work closely with your health care provider to keep conditions, such as diabetes, under control. HOW DO I KNOW IF MY BABY IS DEVELOPING WELL? At each prenatal visit, your health care provider will do several different tests to check on your health and keep track of your baby's development. These include:  Fundal height.  Your health care provider will measure your growing belly from top to bottom using a tape measure.  Your health care provider will also feel your belly to determine your baby's position.  Heartbeat.  An ultrasound in the first trimester can confirm  pregnancy and show a heartbeat, depending on how far along you are.  Your health care provider will check your baby's heart rate at every prenatal visit.  As you get closer to your delivery date, you may have regular fetal heart rate monitoring to make sure that your baby is not in distress.  Second trimester ultrasound.  This ultrasound checks your baby's development. It also indicates your baby's gender. WHAT SHOULD I DO IF I HAVE CONCERNS ABOUT MY BABY'S DEVELOPMENT? Always talk with your health care provider about any concerns that you may have.   This information is not intended to replace advice given to you by your health care provider. Make sure you discuss any questions you have with your health care provider.   Document Released: 11/15/2007 Document Revised: 02/17/2015 Document Reviewed: 11/05/2013 Elsevier Interactive Patient Education 2016 [employer] Preterm Labor Information Preterm  labor is when labor starts at less than 37 weeks of pregnancy. The normal length of a pregnancy is 39 to 41 weeks. CAUSES Often, there is no identifiable underlying cause as to why a woman goes into preterm labor. One of the most common known causes of preterm labor is infection. Infections of the uterus, cervix, vagina, amniotic sac, bladder, kidney, or even the lungs (pneumonia) can cause labor to start. Other suspected causes of preterm labor include:   Urogenital infections, such as yeast infections and bacterial vaginosis.   Uterine abnormalities (uterine shape, uterine septum, fibroids, or bleeding from the placenta).   A cervix that has been operated on (it may fail to stay closed).   Malformations in the fetus.   Multiple gestations (twins, triplets, and so on).   Breakage of the amniotic sac.  RISK FACTORS  Having a previous history of preterm labor.   Having premature rupture of membranes (PROM).   Having a placenta that covers the opening of the cervix (placenta previa).   Having a placenta that separates from the uterus (placental abruption).   Having a cervix that is too weak to hold the fetus in the uterus (incompetent cervix).   Having too much fluid in the amniotic sac (polyhydramnios).   Taking illegal drugs or smoking while pregnant.   Not gaining enough weight while pregnant.   Being younger than 2718 and older than 24 years old.   Having a low socioeconomic status.   Being African American. SYMPTOMS Signs and symptoms of preterm labor include:   Menstrual-like cramps, abdominal pain, or back pain.  Uterine contractions that are regular, as frequent as six in an hour, regardless of their intensity (may be mild or painful).  Contractions that start on the top of the uterus and spread down to the lower abdomen and back.   A sense of increased pelvic pressure.   A watery or bloody mucus discharge that comes from the vagina.   TREATMENT Depending on the length of the pregnancy and other circumstances, your health care provider may suggest bed rest. If necessary, there are medicines that can be given to stop contractions and to mature the fetal lungs. If labor happens before 34 weeks of pregnancy, a prolonged hospital stay may be recommended. Treatment depends on the condition of both you and the fetus.  WHAT SHOULD YOU DO IF YOU THINK YOU ARE IN PRETERM LABOR? Call your health care provider right away. You will need to go to the hospital to get checked immediately. HOW CAN YOU PREVENT PRETERM LABOR IN FUTURE PREGNANCIES? You should:   Stop smoking if you  smoke.  Maintain healthy weight gain and avoid chemicals and drugs that are not necessary.  Be watchful for any type of infection.  Inform your health care provider if you have a known history of preterm labor.   This information is not intended to replace advice given to you by your health care provider. Make sure you discuss any questions you have with your health care provider.   Document Released: 08/19/2003 Document Revised: 01/29/2013 Document Reviewed: 07/01/2012 Elsevier Interactive Patient Education 2016 [employer] Second Trimester of Pregnancy The second trimester is from week 13 through week 28, months 4 through 6. The second trimester is often a time when you feel your best. Your body has also adjusted to being pregnant, and you begin to feel better physically. Usually, morning sickness has lessened or quit completely, you may have more energy, and you may have an increase in appetite. The second trimester is also a time when the fetus is growing rapidly. At the end of the sixth month, the fetus is about 9 inches long and weighs about 1 pounds. You will likely begin to feel the baby move (quickening) between 18 and 20 weeks of the pregnancy. BODY CHANGES Your body goes through many changes during pregnancy. The changes vary from woman to woman.    Your weight will continue to increase. You will notice your lower abdomen bulging out.  You may begin to get stretch marks on your hips, abdomen, and breasts.  You may develop headaches that can be relieved by medicines approved by your health care provider.  You may urinate more often because the fetus is pressing on your bladder.  You may develop or continue to have heartburn as a result of your pregnancy.  You may develop constipation because certain hormones are causing the muscles that push waste through your intestines to slow down.  You may develop hemorrhoids or swollen, bulging veins (varicose veins).  You may have back pain because of the weight gain and pregnancy hormones relaxing your joints between the bones in your pelvis and as a result of a shift in weight and the muscles that support your balance.  Your breasts will continue to grow and be tender.  Your gums may bleed and may be sensitive to brushing and flossing.  Dark spots or blotches (chloasma, mask of pregnancy) may develop on your face. This will likely fade after the baby is born.  A dark line from your belly button to the pubic area (linea nigra) may appear. This will likely fade after the baby is born.  You may have changes in your hair. These can include thickening of your hair, rapid growth, and changes in texture. Some women also have hair loss during or after pregnancy, or hair that feels dry or thin. Your hair will most likely return to normal after your baby is born. WHAT TO EXPECT AT YOUR PRENATAL VISITS During a routine prenatal visit:  You will be weighed to make sure you and the fetus are growing normally.  Your blood pressure will be taken.  Your abdomen will be measured to track your baby's growth.  The fetal heartbeat will be listened to.  Any test results from the previous visit will be discussed. Your health care provider may ask you:  How you are feeling.  If you are feeling the  baby move.  If you have had any abnormal symptoms, such as leaking fluid, bleeding, severe headaches, or abdominal cramping.  If you are using any tobacco  products, including cigarettes, chewing tobacco, and electronic cigarettes.  If you have any questions. Other tests that may be performed during your second trimester include:  Blood tests that check for:  Low iron levels (anemia).  Gestational diabetes (between 24 and 28 weeks).  Rh antibodies.  Urine tests to check for infections, diabetes, or protein in the urine.  An ultrasound to confirm the proper growth and development of the baby.  An amniocentesis to check for possible genetic problems.  Fetal screens for spina bifida and Down syndrome.  HIV (human immunodeficiency virus) testing. Routine prenatal testing includes screening for HIV, unless you choose not to have this test. HOME CARE INSTRUCTIONS   Avoid all smoking, herbs, alcohol, and unprescribed drugs. These chemicals affect the formation and growth of the baby.  Do not use any tobacco products, including cigarettes, chewing tobacco, and electronic cigarettes. If you need help quitting, ask your health care provider. You may receive counseling support and other resources to help you quit.  Follow your health care provider's instructions regarding medicine use. There are medicines that are either safe or unsafe to take during pregnancy.  Exercise only as directed by your health care provider. Experiencing uterine cramps is a good sign to stop exercising.  Continue to eat regular, healthy meals.  Wear a good support bra for breast tenderness.  Do not use hot tubs, steam rooms, or saunas.  Wear your seat belt at all times when driving.  Avoid raw meat, uncooked cheese, cat litter boxes, and soil used by cats. These carry germs that can cause birth defects in the baby.  Take your prenatal vitamins.  Take 1500-2000 mg of calcium daily starting at the 20th week  of pregnancy until you deliver your baby.  Try taking a stool softener (if your health care provider approves) if you develop constipation. Eat more high-fiber foods, such as fresh vegetables or fruit and whole grains. Drink plenty of fluids to keep your urine clear or pale yellow.  Take warm sitz baths to soothe any pain or discomfort caused by hemorrhoids. Use hemorrhoid cream if your health care provider approves.  If you develop varicose veins, wear support hose. Elevate your feet for 15 minutes, 3-4 times a day. Limit salt in your diet.  Avoid heavy lifting, wear low heel shoes, and practice good posture.  Rest with your legs elevated if you have leg cramps or low back pain.  Visit your dentist if you have not gone yet during your pregnancy. Use a soft toothbrush to brush your teeth and be gentle when you floss.  A sexual relationship may be continued unless your health care provider directs you otherwise.  Continue to go to all your prenatal visits as directed by your health care provider. SEEK MEDICAL CARE IF:   You have dizziness.  You have mild pelvic cramps, pelvic pressure, or nagging pain in the abdominal area.  You have persistent nausea, vomiting, or diarrhea.  You have a bad smelling vaginal discharge.  You have pain with urination. SEEK IMMEDIATE MEDICAL CARE IF:   You have a fever.  You are leaking fluid from your vagina.  You have spotting or bleeding from your vagina.  You have severe abdominal cramping or pain.  You have rapid weight gain or loss.  You have shortness of breath with chest pain.  You notice sudden or extreme swelling of your face, hands, ankles, feet, or legs.  You have not felt your baby move in over an hour.  You have severe headaches that do not go away with medicine.  You have vision changes.   This information is not intended to replace advice given to you by your health care provider. Make sure you discuss any questions you  have with your health care provider.   Document Released: 05/23/2001 Document Revised: 06/19/2014 Document Reviewed: 07/30/2012 Elsevier Interactive Patient Education [employer]

## 2016-03-10 NOTE — Progress Notes (Signed)
Subjective:    Colleen Villegas is a 25 y.o. female being seen today for her obstetrical visit. She is at 4978w3d gestation. Patient reports: no bleeding, no contractions, no cramping, no leaking and allergy symptoms . Fetal movement: normal.  Would like to change her PNV to a pill and does not want Flonase at this time.    Problem List Items Addressed This Visit      Other   Supervision of other normal pregnancy, antepartum - Primary   Relevant Medications   Prenat-FeCbn-FeAspGl-FA-Omega (OB COMPLETE PETITE) 35-5-1-200 MG CAPS   Other Relevant Orders   US MFM OB FOLLOW UP    Other Visit Diagnoses   None.    Patient Active Problem List   Diagnosis Date Noted  . Supervision of other normal pregnancy, antepartum 12/10/2015   Objective:    BP 100/63   Pulse 64   Temp 98.6 F (37 C)   Wt 166 lb 9.6 oz (75.6 kg)   LMP 09/25/2015 (LMP Unknown)   BMI 30.47 kg/m  FHT: 135 BPM  Uterine Size: 24 cm and size equals dates     Assessment:    Pregnancy @ 3678w3d    H/O Allergies  Influenza vaccinee  Plan:    OBGCT: discussed. Signs and symptoms of preterm labor: discussed and handout given.  Labs, problem list reviewed and updated 2 hr GTT planned Follow up in 4 weeks.

## 2016-03-15 ENCOUNTER — Telehealth: Payer: Self-pay | Admitting: *Deleted

## 2016-03-15 NOTE — Telephone Encounter (Signed)
PA has been submitted and approved for Levocetirizine 5mg  Rx. LM making pharmacy aware of approval.

## 2016-04-03 ENCOUNTER — Encounter: Payer: Self-pay | Admitting: Family Medicine

## 2016-04-03 ENCOUNTER — Ambulatory Visit (INDEPENDENT_AMBULATORY_CARE_PROVIDER_SITE_OTHER): Payer: Medicaid Other | Admitting: Family Medicine

## 2016-04-03 DIAGNOSIS — Z3480 Encounter for supervision of other normal pregnancy, unspecified trimester: Secondary | ICD-10-CM

## 2016-04-03 DIAGNOSIS — Z3482 Encounter for supervision of other normal pregnancy, second trimester: Secondary | ICD-10-CM

## 2016-04-03 NOTE — Progress Notes (Signed)
   PRENATAL VISIT NOTE  Subjective:  [name] is a 25 y.o. G2P1001 at 2838w6d being seen today for ongoing prenatal care.  She is currently monitored for the following issues for this low-risk pregnancy and has Supervision of other normal pregnancy, antepartum on her problem list.  Patient reports no complaints.  Contractions: Not present. Vag. Bleeding: None.  Movement: Present. Denies leaking of fluid.   The following portions of the patient's history were reviewed and updated as appropriate: allergies, current medications, past family history, past medical history, past social history, past surgical history and problem list. Problem list updated.  Objective:   Vitals:   04/03/16 1008  BP: 103/68  Pulse: 64  Temp: 98 F (36.7 C)  Weight: 170 lb (77.1 kg)    Fetal Status: Fetal Heart Rate (bpm): 140 Fundal Height: 27 cm Movement: Present     General:  Alert, oriented and cooperative. Patient is in no acute distress.  Skin: Skin is warm and dry. No rash noted.   Cardiovascular: Normal heart rate noted  Respiratory: Normal respiratory effort, no problems with respiration noted  Abdomen: Soft, gravid, appropriate for gestational age. Pain/Pressure: Absent     Pelvic:  Cervical exam deferred        Extremities: Normal range of motion.  Edema: None  Mental Status: Normal mood and affect. Normal behavior. Normal judgment and thought content.   Assessment and Plan:  Pregnancy: G2P1001 at 8438w6d  1. Supervision of other normal pregnancy, antepartum Continue routine prenatal care.   Preterm labor symptoms and general obstetric precautions including but not limited to vaginal bleeding, contractions, leaking of fluid and fetal movement were reviewed in detail with the patient. Please refer to After Visit Summary for other counseling recommendations.  Return in 3 weeks (on 04/24/2016) for 28 wk labs.  Reva Boresanya S Isak Sotomayor, MD

## 2016-04-03 NOTE — Patient Instructions (Signed)
Second Trimester of Pregnancy The second trimester is from week 13 through week 28, months 4 through 6. The second trimester is often a time when you feel your best. Your body has also adjusted to being pregnant, and you begin to feel better physically. Usually, morning sickness has lessened or quit completely, you may have more energy, and you may have an increase in appetite. The second trimester is also a time when the fetus is growing rapidly. At the end of the sixth month, the fetus is about 9 inches long and weighs about 1 pounds. You will likely begin to feel the baby move (quickening) between 18 and 20 weeks of the pregnancy. BODY CHANGES Your body goes through many changes during pregnancy. The changes vary from woman to woman.   Your weight will continue to increase. You will notice your lower abdomen bulging out.  You may begin to get stretch marks on your hips, abdomen, and breasts.  You may develop headaches that can be relieved by medicines approved by your health care provider.  You may urinate more often because the fetus is pressing on your bladder.  You may develop or continue to have heartburn as a result of your pregnancy.  You may develop constipation because certain hormones are causing the muscles that push waste through your intestines to slow down.  You may develop hemorrhoids or swollen, bulging veins (varicose veins).  You may have back pain because of the weight gain and pregnancy hormones relaxing your joints between the bones in your pelvis and as a result of a shift in weight and the muscles that support your balance.  Your breasts will continue to grow and be tender.  Your gums may bleed and may be sensitive to brushing and flossing.  Dark spots or blotches (chloasma, mask of pregnancy) may develop on your face. This will likely fade after the baby is born.  A dark line from your belly button to the pubic area (linea nigra) may appear. This will likely  fade after the baby is born.  You may have changes in your hair. These can include thickening of your hair, rapid growth, and changes in texture. Some women also have hair loss during or after pregnancy, or hair that feels dry or thin. Your hair will most likely return to normal after your baby is born. WHAT TO EXPECT AT YOUR PRENATAL VISITS During a routine prenatal visit:  You will be weighed to make sure you and the fetus are growing normally.  Your blood pressure will be taken.  Your abdomen will be measured to track your baby's growth.  The fetal heartbeat will be listened to.  Any test results from the previous visit will be discussed. Your health care provider may ask you:  How you are feeling.  If you are feeling the baby move.  If you have had any abnormal symptoms, such as leaking fluid, bleeding, severe headaches, or abdominal cramping.  If you are using any tobacco products, including cigarettes, chewing tobacco, and electronic cigarettes.  If you have any questions. Other tests that may be performed during your second trimester include:  Blood tests that check for:  Low iron levels (anemia).  Gestational diabetes (between 24 and 28 weeks).  Rh antibodies.  Urine tests to check for infections, diabetes, or protein in the urine.  An ultrasound to confirm the proper growth and development of the baby.  An amniocentesis to check for possible genetic problems.  Fetal screens for spina bifida   and Down syndrome.  HIV (human immunodeficiency virus) testing. Routine prenatal testing includes screening for HIV, unless you choose not to have this test. HOME CARE INSTRUCTIONS   Avoid all smoking, herbs, alcohol, and unprescribed drugs. These chemicals affect the formation and growth of the baby.  Do not use any tobacco products, including cigarettes, chewing tobacco, and electronic cigarettes. If you need help quitting, ask your health care provider. You may receive  counseling support and other resources to help you quit.  Follow your health care provider's instructions regarding medicine use. There are medicines that are either safe or unsafe to take during pregnancy.  Exercise only as directed by your health care provider. Experiencing uterine cramps is a good sign to stop exercising.  Continue to eat regular, healthy meals.  Wear a good support bra for breast tenderness.  Do not use hot tubs, steam rooms, or saunas.  Wear your seat belt at all times when driving.  Avoid raw meat, uncooked cheese, cat litter boxes, and soil used by cats. These carry germs that can cause birth defects in the baby.  Take your prenatal vitamins.  Take 1500-2000 mg of calcium daily starting at the 20th week of pregnancy until you deliver your baby.  Try taking a stool softener (if your health care provider approves) if you develop constipation. Eat more high-fiber foods, such as fresh vegetables or fruit and whole grains. Drink plenty of fluids to keep your urine clear or pale yellow.  Take warm sitz baths to soothe any pain or discomfort caused by hemorrhoids. Use hemorrhoid cream if your health care provider approves.  If you develop varicose veins, wear support hose. Elevate your feet for 15 minutes, 3-4 times a day. Limit salt in your diet.  Avoid heavy lifting, wear low heel shoes, and practice good posture.  Rest with your legs elevated if you have leg cramps or low back pain.  Visit your dentist if you have not gone yet during your pregnancy. Use a soft toothbrush to brush your teeth and be gentle when you floss.  A sexual relationship may be continued unless your health care provider directs you otherwise.  Continue to go to all your prenatal visits as directed by your health care provider. SEEK MEDICAL CARE IF:   You have dizziness.  You have mild pelvic cramps, pelvic pressure, or nagging pain in the abdominal area.  You have persistent nausea,  vomiting, or diarrhea.  You have a bad smelling vaginal discharge.  You have pain with urination. SEEK IMMEDIATE MEDICAL CARE IF:   You have a fever.  You are leaking fluid from your vagina.  You have spotting or bleeding from your vagina.  You have severe abdominal cramping or pain.  You have rapid weight gain or loss.  You have shortness of breath with chest pain.  You notice sudden or extreme swelling of your face, hands, ankles, feet, or legs.  You have not felt your baby move in over an hour.  You have severe headaches that do not go away with medicine.  You have vision changes.   This information is not intended to replace advice given to you by your health care provider. Make sure you discuss any questions you have with your health care provider.   Document Released: 05/23/2001 Document Revised: 06/19/2014 Document Reviewed: 07/30/2012 Elsevier Interactive Patient Education 2016 [employer]   Breastfeeding Deciding to breastfeed is one of the best choices you can make for you and your baby. A change   in hormones during pregnancy causes your breast tissue to grow and increases the number and size of your milk ducts. These hormones also allow proteins, sugars, and fats from your blood supply to make breast milk in your milk-producing glands. Hormones prevent breast milk from being released before your baby is born as well as prompt milk flow after birth. Once breastfeeding has begun, thoughts of your baby, as well as his or her sucking or crying, can stimulate the release of milk from your milk-producing glands.  BENEFITS OF BREASTFEEDING For Your Baby  Your first milk (colostrum) helps your baby's digestive system function better.  There are antibodies in your milk that help your baby fight off infections.  Your baby has a lower incidence of asthma, allergies, and sudden infant death syndrome.  The nutrients in breast milk are better for your baby than infant  formulas and are designed uniquely for your baby's needs.  Breast milk improves your baby's brain development.  Your baby is less likely to develop other conditions, such as childhood obesity, asthma, or type 2 diabetes mellitus. For You  Breastfeeding helps to create a very special bond between you and your baby.  Breastfeeding is convenient. Breast milk is always available at the correct temperature and costs nothing.  Breastfeeding helps to burn calories and helps you lose the weight gained during pregnancy.  Breastfeeding makes your uterus contract to its prepregnancy size faster and slows bleeding (lochia) after you give birth.   Breastfeeding helps to lower your risk of developing type 2 diabetes mellitus, osteoporosis, and breast or ovarian cancer later in life. SIGNS THAT YOUR BABY IS HUNGRY Early Signs of Hunger  Increased alertness or activity.  Stretching.  Movement of the head from side to side.  Movement of the head and opening of the mouth when the corner of the mouth or cheek is stroked (rooting).  Increased sucking sounds, smacking lips, cooing, sighing, or squeaking.  Hand-to-mouth movements.  Increased sucking of fingers or hands. Late Signs of Hunger  Fussing.  Intermittent crying. Extreme Signs of Hunger Signs of extreme hunger will require calming and consoling before your baby will be able to breastfeed successfully. Do not wait for the following signs of extreme hunger to occur before you initiate breastfeeding:  Restlessness.  A loud, strong cry.  Screaming. BREASTFEEDING BASICS Breastfeeding Initiation  Find a comfortable place to sit or lie down, with your neck and back well supported.  Place a pillow or rolled up blanket under your baby to bring him or her to the level of your breast (if you are seated). Nursing pillows are specially designed to help support your arms and your baby while you breastfeed.  Make sure that your baby's  abdomen is facing your abdomen.  Gently massage your breast. With your fingertips, massage from your chest wall toward your nipple in a circular motion. This encourages milk flow. You may need to continue this action during the feeding if your milk flows slowly.  Support your breast with 4 fingers underneath and your thumb above your nipple. Make sure your fingers are well away from your nipple and your baby's mouth.  Stroke your baby's lips gently with your finger or nipple.  When your baby's mouth is open wide enough, quickly bring your baby to your breast, placing your entire nipple and as much of the colored area around your nipple (areola) as possible into your baby's mouth.  More areola should be visible above your baby's upper lip than   below the lower lip.  Your baby's tongue should be between his or her lower gum and your breast.  Ensure that your baby's mouth is correctly positioned around your nipple (latched). Your baby's lips should create a seal on your breast and be turned out (everted).  It is common for your baby to suck about 2-3 minutes in order to start the flow of breast milk. Latching Teaching your baby how to latch on to your breast properly is very important. An improper latch can cause nipple pain and decreased milk supply for you and poor weight gain in your baby. Also, if your baby is not latched onto your nipple properly, he or she may swallow some air during feeding. This can make your baby fussy. Burping your baby when you switch breasts during the feeding can help to get rid of the air. However, teaching your baby to latch on properly is still the best way to prevent fussiness from swallowing air while breastfeeding. Signs that your baby has successfully latched on to your nipple:  Silent tugging or silent sucking, without causing you pain.  Swallowing heard between every 3-4 sucks.  Muscle movement above and in front of his or her ears while sucking. Signs  that your baby has not successfully latched on to nipple:  Sucking sounds or smacking sounds from your baby while breastfeeding.  Nipple pain. If you think your baby has not latched on correctly, slip your finger into the corner of your baby's mouth to break the suction and place it between your baby's gums. Attempt breastfeeding initiation again. Signs of Successful Breastfeeding Signs from your baby:  A gradual decrease in the number of sucks or complete cessation of sucking.  Falling asleep.  Relaxation of his or her body.  Retention of a small amount of milk in his or her mouth.  Letting go of your breast by himself or herself. Signs from you:  Breasts that have increased in firmness, weight, and size 1-3 hours after feeding.  Breasts that are softer immediately after breastfeeding.  Increased milk volume, as well as a change in milk consistency and color by the fifth day of breastfeeding.  Nipples that are not sore, cracked, or bleeding. Signs That Your Baby is Getting Enough Milk  Wetting at least 3 diapers in a 24-hour period. The urine should be clear and pale yellow by age 5 days.  At least 3 stools in a 24-hour period by age 5 days. The stool should be soft and yellow.  At least 3 stools in a 24-hour period by age 7 days. The stool should be seedy and yellow.  No loss of weight greater than 10% of birth weight during the first 3 days of age.  Average weight gain of 4-7 ounces (113-198 g) per week after age 4 days.  Consistent daily weight gain by age 5 days, without weight loss after the age of 2 weeks. After a feeding, your baby may spit up a small amount. This is common. BREASTFEEDING FREQUENCY AND DURATION Frequent feeding will help you make more milk and can prevent sore nipples and breast engorgement. Breastfeed when you feel the need to reduce the fullness of your breasts or when your baby shows signs of hunger. This is called "breastfeeding on demand." Avoid  introducing a pacifier to your baby while you are working to establish breastfeeding (the first 4-6 weeks after your baby is born). After this time you may choose to use a pacifier. Research has shown that   pacifier use during the first year of a baby's life decreases the risk of sudden infant death syndrome (SIDS). Allow your baby to feed on each breast as long as he or she wants. Breastfeed until your baby is finished feeding. When your baby unlatches or falls asleep while feeding from the first breast, offer the second breast. Because newborns are often sleepy in the first few weeks of life, you may need to awaken your baby to get him or her to feed. Breastfeeding times will vary from baby to baby. However, the following rules can serve as a guide to help you ensure that your baby is properly fed:  Newborns (babies 4 weeks of age or younger) may breastfeed every 1-3 hours.  Newborns should not go longer than 3 hours during the day or 5 hours during the night without breastfeeding.  You should breastfeed your baby a minimum of 8 times in a 24-hour period until you begin to introduce solid foods to your baby at around 6 months of age. BREAST MILK PUMPING Pumping and storing breast milk allows you to ensure that your baby is exclusively fed your breast milk, even at times when you are unable to breastfeed. This is especially important if you are going back to work while you are still breastfeeding or when you are not able to be present during feedings. Your lactation consultant can give you guidelines on how long it is safe to store breast milk. A breast pump is a machine that allows you to pump milk from your breast into a sterile bottle. The pumped breast milk can then be stored in a refrigerator or freezer. Some breast pumps are operated by hand, while others use electricity. Ask your lactation consultant which type will work best for you. Breast pumps can be purchased, but some hospitals and  breastfeeding support groups lease breast pumps on a monthly basis. A lactation consultant can teach you how to hand express breast milk, if you prefer not to use a pump. CARING FOR YOUR BREASTS WHILE YOU BREASTFEED Nipples can become dry, cracked, and sore while breastfeeding. The following recommendations can help keep your breasts moisturized and healthy:  Avoid using soap on your nipples.  Wear a supportive bra. Although not required, special nursing bras and tank tops are designed to allow access to your breasts for breastfeeding without taking off your entire bra or top. Avoid wearing underwire-style bras or extremely tight bras.  Air dry your nipples for 3-4minutes after each feeding.  Use only cotton bra pads to absorb leaked breast milk. Leaking of breast milk between feedings is normal.  Use lanolin on your nipples after breastfeeding. Lanolin helps to maintain your skin's normal moisture barrier. If you use pure lanolin, you do not need to wash it off before feeding your baby again. Pure lanolin is not toxic to your baby. You may also hand express a few drops of breast milk and gently massage that milk into your nipples and allow the milk to air dry. In the first few weeks after giving birth, some women experience extremely full breasts (engorgement). Engorgement can make your breasts feel heavy, warm, and tender to the touch. Engorgement peaks within 3-5 days after you give birth. The following recommendations can help ease engorgement:  Completely empty your breasts while breastfeeding or pumping. You may want to start by applying warm, moist heat (in the shower or with warm water-soaked hand towels) just before feeding or pumping. This increases circulation and helps the milk   flow. If your baby does not completely empty your breasts while breastfeeding, pump any extra milk after he or she is finished.  Wear a snug bra (nursing or regular) or tank top for 1-2 days to signal your body  to slightly decrease milk production.  Apply ice packs to your breasts, unless this is too uncomfortable for you.  Make sure that your baby is latched on and positioned properly while breastfeeding. If engorgement persists after 48 hours of following these recommendations, contact your health care provider or a lactation consultant. OVERALL HEALTH CARE RECOMMENDATIONS WHILE BREASTFEEDING  Eat healthy foods. Alternate between meals and snacks, eating 3 of each per day. Because what you eat affects your breast milk, some of the foods may make your baby more irritable than usual. Avoid eating these foods if you are sure that they are negatively affecting your baby.  Drink milk, fruit juice, and water to satisfy your thirst (about 10 glasses a day).  Rest often, relax, and continue to take your prenatal vitamins to prevent fatigue, stress, and anemia.  Continue breast self-awareness checks.  Avoid chewing and smoking tobacco. Chemicals from cigarettes that pass into breast milk and exposure to secondhand smoke may harm your baby.  Avoid alcohol and drug use, including marijuana. Some medicines that may be harmful to your baby can pass through breast milk. It is important to ask your health care provider before taking any medicine, including all over-the-counter and prescription medicine as well as vitamin and herbal supplements. It is possible to become pregnant while breastfeeding. If birth control is desired, ask your health care provider about options that will be safe for your baby. SEEK MEDICAL CARE IF:  You feel like you want to stop breastfeeding or have become frustrated with breastfeeding.  You have painful breasts or nipples.  Your nipples are cracked or bleeding.  Your breasts are red, tender, or warm.  You have a swollen area on either breast.  You have a fever or chills.  You have nausea or vomiting.  You have drainage other than breast milk from your nipples.  Your  breasts do not become full before feedings by the fifth day after you give birth.  You feel sad and depressed.  Your baby is too sleepy to eat well.  Your baby is having trouble sleeping.   Your baby is wetting less than 3 diapers in a 24-hour period.  Your baby has less than 3 stools in a 24-hour period.  Your baby's skin or the white part of his or her eyes becomes yellow.   Your baby is not gaining weight by 5 days of age. SEEK IMMEDIATE MEDICAL CARE IF:  Your baby is overly tired (lethargic) and does not want to wake up and feed.  Your baby develops an unexplained fever.   This information is not intended to replace advice given to you by your health care provider. Make sure you discuss any questions you have with your health care provider.   Document Released: 05/29/2005 Document Revised: 02/17/2015 Document Reviewed: 11/20/2012 Elsevier Interactive Patient Education 2016 [employer]  

## 2016-04-07 ENCOUNTER — Other Ambulatory Visit: Payer: Self-pay | Admitting: Certified Nurse Midwife

## 2016-04-07 ENCOUNTER — Ambulatory Visit (HOSPITAL_COMMUNITY)
Admission: RE | Admit: 2016-04-07 | Discharge: 2016-04-07 | Disposition: A | Discharge status: Home / Self Care | Payer: Medicaid Other | Source: Ambulatory Visit | Attending: Certified Nurse Midwife | Admitting: Certified Nurse Midwife

## 2016-04-07 DIAGNOSIS — Z3482 Encounter for supervision of other normal pregnancy, second trimester: Secondary | ICD-10-CM

## 2016-04-07 DIAGNOSIS — Z362 Encounter for other antenatal screening follow-up: Secondary | ICD-10-CM | POA: Insufficient documentation

## 2016-04-07 DIAGNOSIS — Z3A26 26 weeks gestation of pregnancy: Secondary | ICD-10-CM

## 2016-04-10 ENCOUNTER — Other Ambulatory Visit: Payer: Self-pay | Admitting: Certified Nurse Midwife

## 2016-04-10 DIAGNOSIS — Z3480 Encounter for supervision of other normal pregnancy, unspecified trimester: Secondary | ICD-10-CM

## 2016-04-24 ENCOUNTER — Other Ambulatory Visit: Payer: Medicaid Other

## 2016-04-24 ENCOUNTER — Ambulatory Visit (INDEPENDENT_AMBULATORY_CARE_PROVIDER_SITE_OTHER): Payer: Medicaid Other | Admitting: Certified Nurse Midwife

## 2016-04-24 VITALS — BP 109/70 | HR 64 | Temp 97.3°F | Wt 171.6 lb

## 2016-04-24 DIAGNOSIS — Z3480 Encounter for supervision of other normal pregnancy, unspecified trimester: Secondary | ICD-10-CM

## 2016-04-24 DIAGNOSIS — Z23 Encounter for immunization: Secondary | ICD-10-CM | POA: Diagnosis not present

## 2016-04-24 DIAGNOSIS — Z3493 Encounter for supervision of normal pregnancy, unspecified, third trimester: Secondary | ICD-10-CM

## 2016-04-24 NOTE — Progress Notes (Signed)
Subjective:    Colleen Villegas is a 25 y.o. female being seen today for her obstetrical visit. She is at 138w6d gestation. Patient reports no complaints. Fetal movement: normal.  Encouraged patient that if her wisdom teeth are not bothering her to wait until after the pregnancy for dental work/extraction.   Problem List Items Addressed This Visit      Other   Supervision of other normal pregnancy, antepartum    Other Visit Diagnoses    Encounter for supervision of normal pregnancy in third trimester, unspecified gravidity    -  Primary   Relevant Orders   Glucose Tolerance, 2 Hours w/1 Hour   CBC   HIV antibody (with reflex)   RPR     Patient Active Problem List   Diagnosis Date Noted  . Supervision of other normal pregnancy, antepartum 12/10/2015   Objective:    BP 109/70   Pulse 64   Temp 97.3 F (36.3 C)   Wt 171 lb 9.6 oz (77.8 kg)   LMP 09/25/2015 (LMP Unknown)   BMI 31.39 kg/m  FHT:  130 BPM  Uterine Size: 28 cm and size equals dates  Presentation: cephalic     Assessment:    Pregnancy @ 808w6d weeks   Dental carries  Doing well Plan:    2 hour OGTT today   labs reviewed, problem list updated Consent signed. GBS planning TDAP given  Rhogam given for RH negative Pediatrician: discussed. Infant feeding: plans to breastfeed, plans to bottle feed. Maternity leave: discussed. Cigarette smoking: never smoked. Orders Placed This Encounter  Procedures  . Tdap vaccine greater than or equal to 7yo IM  . Glucose Tolerance, 2 Hours w/1 Hour  . CBC  . HIV antibody (with reflex)  . RPR   No orders of the defined types were placed in this encounter.  Follow up in 2 Weeks.

## 2016-04-25 LAB — CBC
Hematocrit: 36.7 % (ref 34.0–46.6)
Hemoglobin: 12.3 g/dL (ref 11.1–15.9)
MCH: 28.2 pg (ref 26.6–33.0)
MCHC: 33.5 g/dL (ref 31.5–35.7)
MCV: 84 fL (ref 79–97)
PLATELETS: 183 10*3/uL (ref 150–379)
RBC: 4.36 x10E6/uL (ref 3.77–5.28)
RDW: 13.6 % (ref 12.3–15.4)
WBC: 7.1 10*3/uL (ref 3.4–10.8)

## 2016-04-25 LAB — GLUCOSE TOLERANCE, 2 HOURS W/ 1HR
GLUCOSE, 1 HOUR: 161 mg/dL (ref 65–179)
GLUCOSE, FASTING: 77 mg/dL (ref 65–91)
Glucose, 2 hour: 116 mg/dL (ref 65–152)

## 2016-04-25 LAB — HIV ANTIBODY (ROUTINE TESTING W REFLEX): HIV SCREEN 4TH GENERATION: NONREACTIVE

## 2016-04-25 LAB — RPR: RPR Ser Ql: NONREACTIVE

## 2016-04-27 ENCOUNTER — Other Ambulatory Visit: Payer: Self-pay | Admitting: Certified Nurse Midwife

## 2016-04-27 DIAGNOSIS — Z3480 Encounter for supervision of other normal pregnancy, unspecified trimester: Secondary | ICD-10-CM

## 2016-05-10 ENCOUNTER — Encounter: Payer: Medicaid Other | Admitting: Certified Nurse Midwife

## 2016-05-10 ENCOUNTER — Ambulatory Visit (INDEPENDENT_AMBULATORY_CARE_PROVIDER_SITE_OTHER): Payer: Medicaid Other | Admitting: Certified Nurse Midwife

## 2016-05-10 VITALS — BP 121/76 | HR 88 | Wt 172.0 lb

## 2016-05-10 DIAGNOSIS — Z3483 Encounter for supervision of other normal pregnancy, third trimester: Secondary | ICD-10-CM

## 2016-05-10 DIAGNOSIS — Z3480 Encounter for supervision of other normal pregnancy, unspecified trimester: Secondary | ICD-10-CM

## 2016-05-10 NOTE — Patient Instructions (Addendum)
Third Trimester of Pregnancy The third trimester is from week 29 through week 40 (months 7 through 9). The third trimester is a time when the unborn baby (fetus) is growing rapidly. At the end of the ninth month, the fetus is about 20 inches in length and weighs 6-10 pounds. Body changes during your third trimester Your body goes through many changes during pregnancy. The changes vary from woman to woman. During the third trimester:  Your weight will continue to increase. You can expect to gain 25-35 pounds (11-16 kg) by the end of the pregnancy.  You may begin to get stretch marks on your hips, abdomen, and breasts.  You may urinate more often because the fetus is moving lower into your pelvis and pressing on your bladder.  You may develop or continue to have heartburn. This is caused by increased hormones that slow down muscles in the digestive tract.  You may develop or continue to have constipation because increased hormones slow digestion and cause the muscles that push waste through your intestines to relax.  You may develop hemorrhoids. These are swollen veins (varicose veins) in the rectum that can itch or be painful.  You may develop swollen, bulging veins (varicose veins) in your legs.  You may have increased body aches in the pelvis, back, or thighs. This is due to weight gain and increased hormones that are relaxing your joints.  You may have changes in your hair. These can include thickening of your hair, rapid growth, and changes in texture. Some women also have hair loss during or after pregnancy, or hair that feels dry or thin. Your hair will most likely return to normal after your baby is born.  Your breasts will continue to grow and they will continue to become tender. A yellow fluid (colostrum) may leak from your breasts. This is the first milk you are producing for your baby.  Your belly button may stick out.  You may notice more swelling in your hands, face, or  ankles.  You may have increased tingling or numbness in your hands, arms, and legs. The skin on your belly may also feel numb.  You may feel short of breath because of your expanding uterus.  You may have more problems sleeping. This can be caused by the size of your belly, increased need to urinate, and an increase in your body's metabolism.  You may notice the fetus "dropping," or moving lower in your abdomen.  You may have increased vaginal discharge.  Your cervix becomes thin and soft (effaced) near your due date. What to expect at prenatal visits You will have prenatal exams every 2 weeks until week 36. Then you will have weekly prenatal exams. During a routine prenatal visit:  You will be weighed to make sure you and the fetus are growing normally.  Your blood pressure will be taken.  Your abdomen will be measured to track your baby's growth.  The fetal heartbeat will be listened to.  Any test results from the previous visit will be discussed.  You may have a cervical check near your due date to see if you have effaced. At around 36 weeks, your health care provider will check your cervix. At the same time, your health care provider will also perform a test on the secretions of the vaginal tissue. This test is to determine if a type of bacteria, Group B streptococcus, is present. Your health care provider will explain this further. Your health care provider may ask you:    What your birth plan is.  How you are feeling.  If you are feeling the baby move.  If you have had any abnormal symptoms, such as leaking fluid, bleeding, severe headaches, or abdominal cramping.  If you are using any tobacco products, including cigarettes, chewing tobacco, and electronic cigarettes.  If you have any questions. Other tests or screenings that may be performed during your third trimester include:  Blood tests that check for low iron levels (anemia).  Fetal testing to check the health,  activity level, and growth of the fetus. Testing is done if you have certain medical conditions or if there are problems during the pregnancy.  Nonstress test (NST). This test checks the health of your baby to make sure there are no signs of problems, such as the baby not getting enough oxygen. During this test, a belt is placed around your belly. The baby is made to move, and its heart rate is monitored during movement. What is false labor? False labor is a condition in which you feel small, irregular tightenings of the muscles in the womb (contractions) that eventually go away. These are called Braxton Hicks contractions. Contractions may last for hours, days, or even weeks before true labor sets in. If contractions come at regular intervals, become more frequent, increase in intensity, or become painful, you should see your health care provider. What are the signs of labor?  Abdominal cramps.  Regular contractions that start at 10 minutes apart and become stronger and more frequent with time.  Contractions that start on the top of the uterus and spread down to the lower abdomen and back.  Increased pelvic pressure and dull back pain.  A watery or bloody mucus discharge that comes from the vagina.  Leaking of amniotic fluid. This is also known as your "water breaking." It could be a slow trickle or a gush. Let your doctor know if it has a color or strange odor. If you have any of these signs, call your health care provider right away, even if it is before your due date. Follow these instructions at home: Eating and drinking  Continue to eat regular, healthy meals.  Do not eat:  Raw meat or meat spreads.  Unpasteurized milk or cheese.  Unpasteurized juice.  Store-made salad.  Refrigerated smoked seafood.  Hot dogs or deli meat, unless they are piping hot.  More than 6 ounces of albacore tuna a week.  Shark, swordfish, king mackerel, or tile fish.  Store-made salads.  Raw  sprouts, such as mung bean or alfalfa sprouts.  Take prenatal vitamins as told by your health care provider.  Take 1000 mg of calcium daily as told by your health care provider.  If you develop constipation:  Take over-the-counter or prescription medicines.  Drink enough fluid to keep your urine clear or pale yellow.  Eat foods that are high in fiber, such as fresh fruits and vegetables, whole grains, and beans.  Limit foods that are high in fat and processed sugars, such as fried and sweet foods. Activity  Exercise only as directed by your health care provider. Healthy pregnant women should aim for 2 hours and 30 minutes of moderate exercise per week. If you experience any pain or discomfort while exercising, stop.  Avoid heavy lifting.  Do not exercise in extreme heat or humidity, or at high altitudes.  Wear low-heel, comfortable shoes.  Practice good posture.  Do not travel far distances unless it is absolutely necessary and only with the approval   of your health care provider.  Wear your seat belt at all times while in a car, on a bus, or on a plane.  Take frequent breaks and rest with your legs elevated if you have leg cramps or low back pain.  Do not use hot tubs, steam rooms, or saunas.  You may continue to have sex unless your health care provider tells you otherwise. Lifestyle  Do not use any products that contain nicotine or tobacco, such as cigarettes and e-cigarettes. If you need help quitting, ask your health care provider.  Do not drink alcohol.  Do not use any medicinal herbs or unprescribed drugs. These chemicals affect the formation and growth of the baby.  If you develop varicose veins:  Wear support pantyhose or compression stockings as told by your healthcare provider.  Elevate your feet for 15 minutes, 3-4 times a day.  Wear a supportive maternity bra to help with breast tenderness. General instructions  Take over-the-counter and prescription  medicines only as told by your health care provider. There are medicines that are either safe or unsafe to take during pregnancy.  Take warm sitz baths to soothe any pain or discomfort caused by hemorrhoids. Use hemorrhoid cream or witch hazel if your health care provider approves.  Avoid cat litter boxes and soil used by cats. These carry germs that can cause birth defects in the baby. If you have a cat, ask someone to clean the litter box for you.  To prepare for the arrival of your baby:  Take prenatal classes to understand, practice, and ask questions about the labor and delivery.  Make a trial run to the hospital.  Visit the hospital and tour the maternity area.  Arrange for maternity or paternity leave through employers.  Arrange for family and friends to take care of pets while you are in the hospital.  Purchase a rear-facing car seat and make sure you know how to install it in your car.  Pack your hospital bag.  Prepare the baby's nursery. Make sure to remove all pillows and stuffed animals from the baby's crib to prevent suffocation.  Visit your dentist if you have not gone during your pregnancy. Use a soft toothbrush to brush your teeth and be gentle when you floss.  Keep all prenatal follow-up visits as told by your health care provider. This is important. Contact a health care provider if:  You are unsure if you are in labor or if your water has broken.  You become dizzy.  You have mild pelvic cramps, pelvic pressure, or nagging pain in your abdominal area.  You have lower back pain.  You have persistent nausea, vomiting, or diarrhea.  You have an unusual or bad smelling vaginal discharge.  You have pain when you urinate. Get help right away if:  You have a fever.  You are leaking fluid from your vagina.  You have spotting or bleeding from your vagina.  You have severe abdominal pain or cramping.  You have rapid weight loss or weight gain.  You have  shortness of breath with chest pain.  You notice sudden or extreme swelling of your face, hands, ankles, feet, or legs.  Your baby makes fewer than 10 movements in 2 hours.  You have severe headaches that do not go away with medicine.  You have vision changes. Summary  The third trimester is from week 29 through week 40, months 7 through 9. The third trimester is a time when the unborn baby (fetus)   is growing rapidly.  During the third trimester, your discomfort may increase as you and your baby continue to gain weight. You may have abdominal, leg, and back pain, sleeping problems, and an increased need to urinate.  During the third trimester your breasts will keep growing and they will continue to become tender. A yellow fluid (colostrum) may leak from your breasts. This is the first milk you are producing for your baby.  False labor is a condition in which you feel small, irregular tightenings of the muscles in the womb (contractions) that eventually go away. These are called Braxton Hicks contractions. Contractions may last for hours, days, or even weeks before true labor sets in.  Signs of labor can include: abdominal cramps; regular contractions that start at 10 minutes apart and become stronger and more frequent with time; watery or bloody mucus discharge that comes from the vagina; increased pelvic pressure and dull back pain; and leaking of amniotic fluid. This information is not intended to replace advice given to you by your health care provider. Make sure you discuss any questions you have with your health care provider. Document Released: 05/23/2001 Document Revised: 11/04/2015 Document Reviewed: 07/30/2012 Elsevier Interactive Patient Education  2017 [employer]  

## 2016-05-10 NOTE — Progress Notes (Signed)
Subjective:    Colleen Villegas is a 25 y.o. female being seen today for her obstetrical visit. She is at 2065w1d gestation. Patient reports no bleeding, no contractions, no cramping, no leaking and URI symptoms, no fever. Fetal movement: normal.  Problem List Items Addressed This Visit    None     Patient Active Problem List   Diagnosis Date Noted  . Supervision of other normal pregnancy, antepartum 12/10/2015   Objective:    BP 121/76   Pulse 88   Wt 172 lb (78 kg)   LMP 09/25/2015 (LMP Unknown)   BMI 31.46 kg/m  FHT:  134 BPM  Uterine Size: 31 cm and size equals dates  Presentation: cephalic     Assessment:    Pregnancy @ 7965w1d weeks   Acute URI versus allergies  Plan:    OTC Benadryl for congestion   labs reviewed, problem list updated Consent signed. GBS planning TDAP given previously  Rhogam n/a Pediatrician: discussed,  [facility]. Infant feeding: plans to breastfeed, plans to bottle feed. Maternity leave: discussed. Cigarette smoking: never smoked. No orders of the defined types were placed in this encounter.  No orders of the defined types were placed in this encounter.  Follow up in 2 Weeks.

## 2016-05-10 NOTE — Progress Notes (Signed)
172

## 2016-06-01 ENCOUNTER — Ambulatory Visit (INDEPENDENT_AMBULATORY_CARE_PROVIDER_SITE_OTHER): Payer: Medicaid Other | Admitting: Certified Nurse Midwife

## 2016-06-01 ENCOUNTER — Encounter: Payer: Self-pay | Admitting: Certified Nurse Midwife

## 2016-06-01 VITALS — BP 109/68 | HR 91 | Wt 172.0 lb

## 2016-06-01 DIAGNOSIS — Z3483 Encounter for supervision of other normal pregnancy, third trimester: Secondary | ICD-10-CM

## 2016-06-01 DIAGNOSIS — Z3480 Encounter for supervision of other normal pregnancy, unspecified trimester: Secondary | ICD-10-CM

## 2016-06-01 NOTE — Progress Notes (Signed)
Subjective:    [name] is a 25 y.o. female being seen today for her obstetrical visit. She is at 6838w2d gestation. Patient reports no complaints. Fetal movement: normal.  Problem List Items Addressed This Visit      Other   Supervision of other normal pregnancy, antepartum - Primary     Patient Active Problem List   Diagnosis Date Noted  . Supervision of other normal pregnancy, antepartum 12/10/2015   Objective:    BP 109/68   Pulse 91   Wt 172 lb (78 kg)   LMP 09/25/2015 (LMP Unknown)   BMI 31.46 kg/m  FHT:  153 BPM  Uterine Size: 34 cm and size equals dates  Presentation: cephalic     Assessment:    Pregnancy @ 4438w2d weeks   Doing well  Plan:     labs reviewed, problem list updated Consent signed. GBS planning TDAP given previously  Pediatrician: [facility] Infant feeding: plans to breastfeed, plans to bottle feed. Maternity leave: discussed. Cigarette smoking: never smoked. No orders of the defined types were placed in this encounter.  Meds ordered this encounter  Medications  . acetaminophen (TYLENOL) 325 MG tablet    Sig: Take 325 mg by mouth as needed.   Follow up in 1 Week.

## 2016-06-01 NOTE — Patient Instructions (Addendum)
Third Trimester of Pregnancy The third trimester is from week 29 through week 40 (months 7 through 9). The third trimester is a time when the unborn baby (fetus) is growing rapidly. At the end of the ninth month, the fetus is about 20 inches in length and weighs 6-10 pounds. Body changes during your third trimester Your body goes through many changes during pregnancy. The changes vary from woman to woman. During the third trimester:  Your weight will continue to increase. You can expect to gain 25-35 pounds (11-16 kg) by the end of the pregnancy.  You may begin to get stretch marks on your hips, abdomen, and breasts.  You may urinate more often because the fetus is moving lower into your pelvis and pressing on your bladder.  You may develop or continue to have heartburn. This is caused by increased hormones that slow down muscles in the digestive tract.  You may develop or continue to have constipation because increased hormones slow digestion and cause the muscles that push waste through your intestines to relax.  You may develop hemorrhoids. These are swollen veins (varicose veins) in the rectum that can itch or be painful.  You may develop swollen, bulging veins (varicose veins) in your legs.  You may have increased body aches in the pelvis, back, or thighs. This is due to weight gain and increased hormones that are relaxing your joints.  You may have changes in your hair. These can include thickening of your hair, rapid growth, and changes in texture. Some women also have hair loss during or after pregnancy, or hair that feels dry or thin. Your hair will most likely return to normal after your baby is born.  Your breasts will continue to grow and they will continue to become tender. A yellow fluid (colostrum) may leak from your breasts. This is the first milk you are producing for your baby.  Your belly button may stick out.  You may notice more swelling in your hands, face, or  ankles.  You may have increased tingling or numbness in your hands, arms, and legs. The skin on your belly may also feel numb.  You may feel short of breath because of your expanding uterus.  You may have more problems sleeping. This can be caused by the size of your belly, increased need to urinate, and an increase in your body's metabolism.  You may notice the fetus "dropping," or moving lower in your abdomen.  You may have increased vaginal discharge.  Your cervix becomes thin and soft (effaced) near your due date. What to expect at prenatal visits You will have prenatal exams every 2 weeks until week 36. Then you will have weekly prenatal exams. During a routine prenatal visit:  You will be weighed to make sure you and the fetus are growing normally.  Your blood pressure will be taken.  Your abdomen will be measured to track your baby's growth.  The fetal heartbeat will be listened to.  Any test results from the previous visit will be discussed.  You may have a cervical check near your due date to see if you have effaced. At around 36 weeks, your health care provider will check your cervix. At the same time, your health care provider will also perform a test on the secretions of the vaginal tissue. This test is to determine if a type of bacteria, Group B streptococcus, is present. Your health care provider will explain this further. Your health care provider may ask you:    What your birth plan is.  How you are feeling.  If you are feeling the baby move.  If you have had any abnormal symptoms, such as leaking fluid, bleeding, severe headaches, or abdominal cramping.  If you are using any tobacco products, including cigarettes, chewing tobacco, and electronic cigarettes.  If you have any questions. Other tests or screenings that may be performed during your third trimester include:  Blood tests that check for low iron levels (anemia).  Fetal testing to check the health,  activity level, and growth of the fetus. Testing is done if you have certain medical conditions or if there are problems during the pregnancy.  Nonstress test (NST). This test checks the health of your baby to make sure there are no signs of problems, such as the baby not getting enough oxygen. During this test, a belt is placed around your belly. The baby is made to move, and its heart rate is monitored during movement. What is false labor? False labor is a condition in which you feel small, irregular tightenings of the muscles in the womb (contractions) that eventually go away. These are called Braxton Hicks contractions. Contractions may last for hours, days, or even weeks before true labor sets in. If contractions come at regular intervals, become more frequent, increase in intensity, or become painful, you should see your health care provider. What are the signs of labor?  Abdominal cramps.  Regular contractions that start at 10 minutes apart and become stronger and more frequent with time.  Contractions that start on the top of the uterus and spread down to the lower abdomen and back.  Increased pelvic pressure and dull back pain.  A watery or bloody mucus discharge that comes from the vagina.  Leaking of amniotic fluid. This is also known as your "water breaking." It could be a slow trickle or a gush. Let your doctor know if it has a color or strange odor. If you have any of these signs, call your health care provider right away, even if it is before your due date. Follow these instructions at home: Eating and drinking  Continue to eat regular, healthy meals.  Do not eat:  Raw meat or meat spreads.  Unpasteurized milk or cheese.  Unpasteurized juice.  Store-made salad.  Refrigerated smoked seafood.  Hot dogs or deli meat, unless they are piping hot.  More than 6 ounces of albacore tuna a week.  Shark, swordfish, king mackerel, or tile fish.  Store-made salads.  Raw  sprouts, such as mung bean or alfalfa sprouts.  Take prenatal vitamins as told by your health care provider.  Take 1000 mg of calcium daily as told by your health care provider.  If you develop constipation:  Take over-the-counter or prescription medicines.  Drink enough fluid to keep your urine clear or pale yellow.  Eat foods that are high in fiber, such as fresh fruits and vegetables, whole grains, and beans.  Limit foods that are high in fat and processed sugars, such as fried and sweet foods. Activity  Exercise only as directed by your health care provider. Healthy pregnant women should aim for 2 hours and 30 minutes of moderate exercise per week. If you experience any pain or discomfort while exercising, stop.  Avoid heavy lifting.  Do not exercise in extreme heat or humidity, or at high altitudes.  Wear low-heel, comfortable shoes.  Practice good posture.  Do not travel far distances unless it is absolutely necessary and only with the approval   of your health care provider.  Wear your seat belt at all times while in a car, on a bus, or on a plane.  Take frequent breaks and rest with your legs elevated if you have leg cramps or low back pain.  Do not use hot tubs, steam rooms, or saunas.  You may continue to have sex unless your health care provider tells you otherwise. Lifestyle  Do not use any products that contain nicotine or tobacco, such as cigarettes and e-cigarettes. If you need help quitting, ask your health care provider.  Do not drink alcohol.  Do not use any medicinal herbs or unprescribed drugs. These chemicals affect the formation and growth of the baby.  If you develop varicose veins:  Wear support pantyhose or compression stockings as told by your healthcare provider.  Elevate your feet for 15 minutes, 3-4 times a day.  Wear a supportive maternity bra to help with breast tenderness. General instructions  Take over-the-counter and prescription  medicines only as told by your health care provider. There are medicines that are either safe or unsafe to take during pregnancy.  Take warm sitz baths to soothe any pain or discomfort caused by hemorrhoids. Use hemorrhoid cream or witch hazel if your health care provider approves.  Avoid cat litter boxes and soil used by cats. These carry germs that can cause birth defects in the baby. If you have a cat, ask someone to clean the litter box for you.  To prepare for the arrival of your baby:  Take prenatal classes to understand, practice, and ask questions about the labor and delivery.  Make a trial run to the hospital.  Visit the hospital and tour the maternity area.  Arrange for maternity or paternity leave through employers.  Arrange for family and friends to take care of pets while you are in the hospital.  Purchase a rear-facing car seat and make sure you know how to install it in your car.  Pack your hospital bag.  Prepare the baby's nursery. Make sure to remove all pillows and stuffed animals from the baby's crib to prevent suffocation.  Visit your dentist if you have not gone during your pregnancy. Use a soft toothbrush to brush your teeth and be gentle when you floss.  Keep all prenatal follow-up visits as told by your health care provider. This is important. Contact a health care provider if:  You are unsure if you are in labor or if your water has broken.  You become dizzy.  You have mild pelvic cramps, pelvic pressure, or nagging pain in your abdominal area.  You have lower back pain.  You have persistent nausea, vomiting, or diarrhea.  You have an unusual or bad smelling vaginal discharge.  You have pain when you urinate. Get help right away if:  You have a fever.  You are leaking fluid from your vagina.  You have spotting or bleeding from your vagina.  You have severe abdominal pain or cramping.  You have rapid weight loss or weight gain.  You have  shortness of breath with chest pain.  You notice sudden or extreme swelling of your face, hands, ankles, feet, or legs.  Your baby makes fewer than 10 movements in 2 hours.  You have severe headaches that do not go away with medicine.  You have vision changes. Summary  The third trimester is from week 29 through week 40, months 7 through 9. The third trimester is a time when the unborn baby (fetus)   is growing rapidly.  During the third trimester, your discomfort may increase as you and your baby continue to gain weight. You may have abdominal, leg, and back pain, sleeping problems, and an increased need to urinate.  During the third trimester your breasts will keep growing and they will continue to become tender. A yellow fluid (colostrum) may leak from your breasts. This is the first milk you are producing for your baby.  False labor is a condition in which you feel small, irregular tightenings of the muscles in the womb (contractions) that eventually go away. These are called Braxton Hicks contractions. Contractions may last for hours, days, or even weeks before true labor sets in.  Signs of labor can include: abdominal cramps; regular contractions that start at 10 minutes apart and become stronger and more frequent with time; watery or bloody mucus discharge that comes from the vagina; increased pelvic pressure and dull back pain; and leaking of amniotic fluid. This information is not intended to replace advice given to you by your health care provider. Make sure you discuss any questions you have with your health care provider. Document Released: 05/23/2001 Document Revised: 11/04/2015 Document Reviewed: 07/30/2012 Elsevier Interactive Patient Education  2017 [employer]  

## 2016-06-09 ENCOUNTER — Ambulatory Visit (INDEPENDENT_AMBULATORY_CARE_PROVIDER_SITE_OTHER): Payer: Medicaid Other | Admitting: Obstetrics and Gynecology

## 2016-06-09 ENCOUNTER — Other Ambulatory Visit (HOSPITAL_COMMUNITY)
Admission: RE | Admit: 2016-06-09 | Discharge: 2016-06-09 | Disposition: A | Discharge status: Home / Self Care | Payer: Medicaid Other | Source: Ambulatory Visit | Attending: Obstetrics and Gynecology | Admitting: Obstetrics and Gynecology

## 2016-06-09 VITALS — BP 108/74 | HR 79 | Wt 173.9 lb

## 2016-06-09 DIAGNOSIS — Z3493 Encounter for supervision of normal pregnancy, unspecified, third trimester: Secondary | ICD-10-CM

## 2016-06-09 DIAGNOSIS — Z3480 Encounter for supervision of other normal pregnancy, unspecified trimester: Secondary | ICD-10-CM

## 2016-06-09 LAB — OB RESULTS CONSOLE GBS: GBS: POSITIVE

## 2016-06-09 NOTE — Progress Notes (Signed)
Prenatal Visit Note Date: 06/09/2016 Clinic: [facility]  Subjective:  Colleen Villegas is a 25 y.o. G2P1001 at 732w3d being seen today for ongoing prenatal care.  She is currently monitored for the following issues for this low-risk pregnancy and has Supervision of other normal pregnancy, antepartum on her problem list.  Patient reports no complaints.   Contractions: Irregular. Vag. Bleeding: None.  Movement: Present. Denies leaking of fluid.   The following portions of the patient's history were reviewed and updated as appropriate: allergies, current medications, past family history, past medical history, past social history, past surgical history and problem list. Problem list updated.  Objective:   Vitals:   06/09/16 0808  BP: 108/74  Pulse: 79  Weight: 173 lb 14.4 oz (78.9 kg)    Fetal Status: Fetal Heart Rate (bpm): 140s Fundal Height: 35 cm Movement: Present  Presentation: Vertex  General:  Alert, oriented and cooperative. Patient is in no acute distress.  Skin: Skin is warm and dry. No rash noted.   Cardiovascular: Normal heart rate noted  Respiratory: Normal respiratory effort, no problems with respiration noted  Abdomen: Soft, gravid, appropriate for gestational age. Pain/Pressure: Absent     Pelvic:  Cervical exam performed Dilation: 1 Effacement (%): 50 Station: -2 (pt desired)  Extremities: Normal range of motion.     Mental Status: Normal mood and affect. Normal behavior. Normal judgment and thought content.   Urinalysis:      Assessment and Plan:  Pregnancy: G2P1001 at 262w3d  1. Encounter for supervision of normal pregnancy in third trimester, unspecified gravidity Routine care. OCPs - Strep Gp B NAA - GC/Chlamydia probe amp ([facility]  Preterm labor symptoms and general obstetric precautions including but not limited to vaginal bleeding, contractions, leaking of fluid and fetal movement were reviewed in detail with the  patient. Please refer to After Visit Summary for other counseling recommendations.  Return in about 1 week (around 06/16/2016) for 7-10d rob.   [place] Bingharlie Sonyia Muro, MD

## 2016-06-11 ENCOUNTER — Encounter: Payer: Self-pay | Admitting: Obstetrics and Gynecology

## 2016-06-11 DIAGNOSIS — O99820 Streptococcus B carrier state complicating pregnancy: Secondary | ICD-10-CM | POA: Insufficient documentation

## 2016-06-11 LAB — STREP GP B NAA: STREP GROUP B AG: POSITIVE — AB

## 2016-06-12 NOTE — L&D Delivery Note (Addendum)
Delivery Note Progressed quickly to complete dilation.   Just finished Ampicillin prior to delivery.   Pushed well with descent through pelvis.   At 1:07 PM a viable and healthy female was delivered via Vaginal, Spontaneous Delivery (Presentation: LOA).  APGAR: 9, 9; weight  .   Placenta status: spontaneous and grossly intact with 3 vessel Cord:  with the following complications: .none  Anesthesia:   Episiotomy: None Lacerations: 1st degree Suture Repair: 3.0 Monocryl Est. Blood Loss (mL):    Mom to postpartum.  Baby to Couplet care / Skin to Skin.  Colleen Villegas 07/08/2016, 1:33 PM

## 2016-06-13 ENCOUNTER — Other Ambulatory Visit (HOSPITAL_COMMUNITY)
Admission: RE | Admit: 2016-06-13 | Discharge: 2016-06-13 | Disposition: A | Discharge status: Home / Self Care | Payer: Medicaid Other | Source: Ambulatory Visit | Attending: Obstetrics | Admitting: Obstetrics

## 2016-06-13 ENCOUNTER — Ambulatory Visit (INDEPENDENT_AMBULATORY_CARE_PROVIDER_SITE_OTHER): Payer: Medicaid Other | Admitting: Obstetrics

## 2016-06-13 ENCOUNTER — Encounter: Payer: Self-pay | Admitting: Obstetrics

## 2016-06-13 VITALS — BP 119/72 | HR 87 | Wt 175.0 lb

## 2016-06-13 DIAGNOSIS — Z113 Encounter for screening for infections with a predominantly sexual mode of transmission: Secondary | ICD-10-CM | POA: Insufficient documentation

## 2016-06-13 DIAGNOSIS — Z3480 Encounter for supervision of other normal pregnancy, unspecified trimester: Secondary | ICD-10-CM

## 2016-06-13 DIAGNOSIS — Z3483 Encounter for supervision of other normal pregnancy, third trimester: Secondary | ICD-10-CM

## 2016-06-13 LAB — GC/CHLAMYDIA PROBE AMP (~~LOC~~) NOT AT ARMC
CHLAMYDIA, DNA PROBE: NEGATIVE
Neisseria Gonorrhea: NEGATIVE

## 2016-06-13 NOTE — Progress Notes (Signed)
Subjective:    Rea CollegeBrenda J Payeur is a 26 y.o. female being seen today for her obstetrical visit. She is at 7152w0d gestation. Patient reports no complaints. Fetal movement: normal.  Problem List Items Addressed This Visit    Supervision of other normal pregnancy, antepartum - Primary   Relevant Orders   GC/Chlamydia Probe Amp     Patient Active Problem List   Diagnosis Date Noted  . GBS (group B Streptococcus carrier), +RV culture, currently pregnant 06/11/2016  . Supervision of other normal pregnancy, antepartum 12/10/2015   Objective:    BP 119/72   Pulse 87   Wt 175 lb (79.4 kg)   LMP 09/25/2015 (LMP Unknown)   BMI 32.01 kg/m  FHT:  150 BPM  Uterine Size: size equals dates  Presentation: unsure     Assessment:    Pregnancy @ 3652w0d weeks   Plan:     labs reviewed, problem list updated Consent signed. GBS sent TDAP offered  Rhogam given for RH negative Pediatrician: discussed. Infant feeding: plans to breastfeed. Maternity leave: discussed. Cigarette smoking: never smoked. Orders Placed This Encounter  Procedures  . GC/Chlamydia Probe Amp   No orders of the defined types were placed in this encounter.  Follow up in 1 Week.   Patient ID: Rea CollegeBrenda J Engelhard, female   DOB: Jul 31, 1990, 26 y.o.   MRN: [id number]

## 2016-06-13 NOTE — Patient Instructions (Addendum)

## 2016-06-14 LAB — URINE CYTOLOGY ANCILLARY ONLY
Chlamydia: NEGATIVE
Neisseria Gonorrhea: NEGATIVE

## 2016-06-15 LAB — GC/CHLAMYDIA PROBE AMP
CHLAMYDIA, DNA PROBE: NEGATIVE
Neisseria gonorrhoeae by PCR: NEGATIVE

## 2016-06-21 ENCOUNTER — Encounter: Payer: Self-pay | Admitting: Obstetrics

## 2016-06-21 ENCOUNTER — Ambulatory Visit (INDEPENDENT_AMBULATORY_CARE_PROVIDER_SITE_OTHER): Payer: Medicaid Other | Admitting: Obstetrics

## 2016-06-21 DIAGNOSIS — Z3483 Encounter for supervision of other normal pregnancy, third trimester: Secondary | ICD-10-CM

## 2016-06-21 DIAGNOSIS — Z3480 Encounter for supervision of other normal pregnancy, unspecified trimester: Secondary | ICD-10-CM

## 2016-06-21 NOTE — Progress Notes (Signed)
Patient reports increasing pressure and contractions- but nothing that is signaling labor

## 2016-06-21 NOTE — Progress Notes (Signed)
Subjective:    [name] is a 26 y.o. female being seen today for her obstetrical visit. She is at 4066w1d gestation. Patient reports occasional contractions. Fetal movement: normal.  Problem List Items Addressed This Visit    Supervision of other normal pregnancy, antepartum     Patient Active Problem List   Diagnosis Date Noted  . GBS (group B Streptococcus carrier), +RV culture, currently pregnant 06/11/2016  . Supervision of other normal pregnancy, antepartum 12/10/2015    Objective:    BP 114/75   Pulse 73   Wt 176 lb 3.2 oz (79.9 kg)   LMP 09/25/2015 (LMP Unknown)   BMI 32.23 kg/m  FHT: 150 BPM  Uterine Size: size equals dates    Assessment:    Pregnancy @ 7666w1d weeks   Plan:   Plans for delivery: Vaginal anticipated; labs reviewed; problem list updated Counseling: Consent signed. Infant feeding: plans to breastfeed. Cigarette smoking: never smoked. L&D discussion: symptoms of labor, discussed when to call, discussed what number to call, anesthetic/analgesic options reviewed and delivering clinician:  plans no preference. Postpartum supports and preparation: circumcision discussed and contraception plans discussed.  Follow up in 1 Week.  Patient ID: [name], female   DOB: [birth year], 26 y.o.   MRN: [id number]

## 2016-06-28 ENCOUNTER — Encounter: Payer: Medicaid Other | Admitting: Obstetrics

## 2016-06-29 ENCOUNTER — Encounter: Payer: Medicaid Other | Admitting: Family Medicine

## 2016-06-29 ENCOUNTER — Encounter: Payer: Medicaid Other | Admitting: Obstetrics

## 2016-07-05 ENCOUNTER — Ambulatory Visit (INDEPENDENT_AMBULATORY_CARE_PROVIDER_SITE_OTHER): Payer: Medicaid Other | Admitting: Obstetrics

## 2016-07-05 VITALS — BP 114/75 | HR 83 | Wt 179.8 lb

## 2016-07-05 DIAGNOSIS — Z3480 Encounter for supervision of other normal pregnancy, unspecified trimester: Secondary | ICD-10-CM

## 2016-07-05 DIAGNOSIS — Z3483 Encounter for supervision of other normal pregnancy, third trimester: Secondary | ICD-10-CM

## 2016-07-05 NOTE — Progress Notes (Signed)
Patient request cervix check and wants to know if she may be leaking fliud

## 2016-07-06 ENCOUNTER — Encounter: Payer: Self-pay | Admitting: Obstetrics

## 2016-07-06 NOTE — Progress Notes (Signed)
Subjective:    Colleen Villegas is a 26 y.o. female being seen today for her obstetrical visit. She is at 3477w2d gestation. Patient reports no complaints. Fetal movement: normal.  Problem List Items Addressed This Visit    None     Patient Active Problem List   Diagnosis Date Noted  . GBS (group B Streptococcus carrier), +RV culture, currently pregnant 06/11/2016  . Supervision of other normal pregnancy, antepartum 12/10/2015    Objective:    BP 114/75   Pulse 83   Wt 179 lb 12.8 oz (81.6 kg)   LMP 09/25/2015 (LMP Unknown)   BMI 32.89 kg/m  FHT: 150 BPM  Uterine Size: size equals dates  Presentations: cephalic  Pelvic Exam:              Dilation: 2cm       Effacement: 50%             Station:  -3    Consistency: soft            Position: posterior     Assessment:    Pregnancy @ 5177w2d weeks   Plan:   Plans for delivery: Vaginal anticipated; labs reviewed; problem list updated Counseling: Consent signed. Infant feeding: plans to breastfeed. Cigarette smoking: never smoked. L&D discussion: symptoms of labor, discussed when to call, discussed what number to call, anesthetic/analgesic options reviewed and delivering clinician:  plans no preference. Postpartum supports and preparation: circumcision discussed and contraception plans discussed.  Follow up in 1 Week.  Patient ID: Colleen Villegas, female   DOB: 02/01/1991, 26 y.o.   MRN: [id number]

## 2016-07-08 ENCOUNTER — Encounter (HOSPITAL_COMMUNITY): Payer: Self-pay | Admitting: *Deleted

## 2016-07-08 ENCOUNTER — Inpatient Hospital Stay (HOSPITAL_COMMUNITY)
Admission: AD | Admit: 2016-07-08 | Discharge: 2016-07-10 | DRG: 775 | Disposition: A | Discharge status: Home / Self Care | Payer: Medicaid Other | Source: Ambulatory Visit | Attending: Obstetrics and Gynecology | Admitting: Obstetrics and Gynecology

## 2016-07-08 DIAGNOSIS — O99824 Streptococcus B carrier state complicating childbirth: Secondary | ICD-10-CM | POA: Diagnosis present

## 2016-07-08 DIAGNOSIS — Z8249 Family history of ischemic heart disease and other diseases of the circulatory system: Secondary | ICD-10-CM

## 2016-07-08 DIAGNOSIS — Z3493 Encounter for supervision of normal pregnancy, unspecified, third trimester: Secondary | ICD-10-CM | POA: Diagnosis present

## 2016-07-08 DIAGNOSIS — Z3A39 39 weeks gestation of pregnancy: Secondary | ICD-10-CM | POA: Diagnosis not present

## 2016-07-08 DIAGNOSIS — Z833 Family history of diabetes mellitus: Secondary | ICD-10-CM

## 2016-07-08 LAB — CBC
HCT: 37.9 % (ref 36.0–46.0)
Hemoglobin: 13.1 g/dL (ref 12.0–15.0)
MCH: 27.6 pg (ref 26.0–34.0)
MCHC: 34.6 g/dL (ref 30.0–36.0)
MCV: 80 fL (ref 78.0–100.0)
PLATELETS: 162 10*3/uL (ref 150–400)
RBC: 4.74 MIL/uL (ref 3.87–5.11)
RDW: 15.7 % — ABNORMAL HIGH (ref 11.5–15.5)
WBC: 14.1 10*3/uL — ABNORMAL HIGH (ref 4.0–10.5)

## 2016-07-08 LAB — TYPE AND SCREEN
ABO/RH(D): O POS
Antibody Screen: NEGATIVE

## 2016-07-08 LAB — POCT FERN TEST: POCT FERN TEST: POSITIVE

## 2016-07-08 MED ORDER — ZOLPIDEM TARTRATE 5 MG PO TABS: 5.0000 mg | ORAL_TABLET | Freq: Every evening | ORAL | Status: DC | PRN

## 2016-07-08 MED ORDER — PRENATAL MULTIVITAMIN CH
1.0000 | ORAL_TABLET | Freq: Every day | ORAL | Status: DC
  Administered 2016-07-09: 1 via ORAL

## 2016-07-08 MED ORDER — OXYCODONE-ACETAMINOPHEN 5-325 MG PO TABS: 2.0000 | ORAL_TABLET | ORAL | Status: DC | PRN

## 2016-07-08 MED ORDER — LIDOCAINE HCL (PF) 1 % IJ SOLN
30.0000 mL | INTRAMUSCULAR | Status: AC | PRN
  Administered 2016-07-08: 30 mL via SUBCUTANEOUS
  Filled 2016-07-08: qty 30

## 2016-07-08 MED ORDER — FENTANYL 2.5 MCG/ML BUPIVACAINE 1/10 % EPIDURAL INFUSION (WH - ANES): 14.0000 mL/h | INTRAMUSCULAR | Status: DC | PRN

## 2016-07-08 MED ORDER — OXYCODONE-ACETAMINOPHEN 5-325 MG PO TABS
1.0000 | ORAL_TABLET | ORAL | Status: DC | PRN
  Administered 2016-07-08: 1 via ORAL
  Filled 2016-07-08: qty 1

## 2016-07-08 MED ORDER — SIMETHICONE 80 MG PO CHEW: 80.0000 mg | CHEWABLE_TABLET | ORAL | Status: DC | PRN

## 2016-07-08 MED ORDER — DIPHENHYDRAMINE HCL 50 MG/ML IJ SOLN: 12.5000 mg | INTRAMUSCULAR | Status: DC | PRN

## 2016-07-08 MED ORDER — EPHEDRINE 5 MG/ML INJ
10.0000 mg | INTRAVENOUS | Status: DC | PRN
  Filled 2016-07-08: qty 4

## 2016-07-08 MED ORDER — ONDANSETRON HCL 4 MG PO TABS: 4.0000 mg | ORAL_TABLET | ORAL | Status: DC | PRN

## 2016-07-08 MED ORDER — FLEET ENEMA 7-19 GM/118ML RE ENEM: 1.0000 | ENEMA | RECTAL | Status: DC | PRN

## 2016-07-08 MED ORDER — COCONUT OIL OIL
1.0000 "application " | TOPICAL_OIL | Status: DC | PRN
  Administered 2016-07-09: 1 via TOPICAL
  Filled 2016-07-08: qty 120

## 2016-07-08 MED ORDER — LACTATED RINGERS IV SOLN: 500.0000 mL | Freq: Once | INTRAVENOUS | Status: DC

## 2016-07-08 MED ORDER — IBUPROFEN 600 MG PO TABS
600.0000 mg | ORAL_TABLET | Freq: Four times a day (QID) | ORAL | Status: DC
  Administered 2016-07-08 – 2016-07-10 (×6): 600 mg via ORAL
  Filled 2016-07-08 (×6): qty 1

## 2016-07-08 MED ORDER — PHENYLEPHRINE 40 MCG/ML (10ML) SYRINGE FOR IV PUSH (FOR BLOOD PRESSURE SUPPORT)
80.0000 ug | PREFILLED_SYRINGE | INTRAVENOUS | Status: DC | PRN
  Filled 2016-07-08: qty 5

## 2016-07-08 MED ORDER — ACETAMINOPHEN 325 MG PO TABS
650.0000 mg | ORAL_TABLET | ORAL | Status: DC | PRN
  Administered 2016-07-09: 650 mg via ORAL
  Filled 2016-07-08: qty 2

## 2016-07-08 MED ORDER — SOD CITRATE-CITRIC ACID 500-334 MG/5ML PO SOLN: 30.0000 mL | ORAL | Status: DC | PRN

## 2016-07-08 MED ORDER — DIPHENHYDRAMINE HCL 25 MG PO CAPS: 25.0000 mg | ORAL_CAPSULE | Freq: Four times a day (QID) | ORAL | Status: DC | PRN

## 2016-07-08 MED ORDER — LACTATED RINGERS IV SOLN
INTRAVENOUS | Status: DC
  Administered 2016-07-08: 13:00:00 via INTRAVENOUS

## 2016-07-08 MED ORDER — TETANUS-DIPHTH-ACELL PERTUSSIS 5-2.5-18.5 LF-MCG/0.5 IM SUSP: 0.5000 mL | Freq: Once | INTRAMUSCULAR | Status: DC

## 2016-07-08 MED ORDER — ONDANSETRON HCL 4 MG/2ML IJ SOLN: 4.0000 mg | INTRAMUSCULAR | Status: DC | PRN

## 2016-07-08 MED ORDER — ACETAMINOPHEN 325 MG PO TABS
650.0000 mg | ORAL_TABLET | ORAL | Status: DC | PRN
Start: 2016-07-08 — End: 2016-07-08

## 2016-07-08 MED ORDER — SENNOSIDES-DOCUSATE SODIUM 8.6-50 MG PO TABS
2.0000 | ORAL_TABLET | ORAL | Status: DC
  Administered 2016-07-09 (×2): 2 via ORAL
  Filled 2016-07-08 (×2): qty 2

## 2016-07-08 MED ORDER — ONDANSETRON HCL 4 MG/2ML IJ SOLN: 4.0000 mg | Freq: Four times a day (QID) | INTRAMUSCULAR | Status: DC | PRN

## 2016-07-08 MED ORDER — OXYTOCIN 40 UNITS IN LACTATED RINGERS INFUSION - SIMPLE MED
2.5000 [IU]/h | INTRAVENOUS | Status: DC
  Filled 2016-07-08: qty 1000

## 2016-07-08 MED ORDER — LACTATED RINGERS IV SOLN: 500.0000 mL | INTRAVENOUS | Status: DC | PRN

## 2016-07-08 MED ORDER — BENZOCAINE-MENTHOL 20-0.5 % EX AERO
1.0000 "application " | INHALATION_SPRAY | CUTANEOUS | Status: DC | PRN
  Administered 2016-07-08: 1 via TOPICAL
  Filled 2016-07-08: qty 56

## 2016-07-08 MED ORDER — WITCH HAZEL-GLYCERIN EX PADS: 1.0000 "application " | MEDICATED_PAD | CUTANEOUS | Status: DC | PRN

## 2016-07-08 MED ORDER — DIBUCAINE 1 % RE OINT: 1.0000 "application " | TOPICAL_OINTMENT | RECTAL | Status: DC | PRN

## 2016-07-08 MED ORDER — SODIUM CHLORIDE 0.9 % IV SOLN
2.0000 g | Freq: Once | INTRAVENOUS | Status: AC
  Administered 2016-07-08: 2 g via INTRAVENOUS
  Filled 2016-07-08: qty 2000

## 2016-07-08 MED ORDER — OXYTOCIN BOLUS FROM INFUSION
500.0000 mL | Freq: Once | INTRAVENOUS | Status: AC
Start: 2016-07-08 — End: 2016-07-08
  Administered 2016-07-08: 500 mL via INTRAVENOUS

## 2016-07-08 NOTE — H&P (Signed)
Colleen Villegas is a 26 y.o. female presenting for Labor.  Started last night and got stronger today.  Followed at [place], uncomplicated pregnancy except for GBS + . OB History    Gravida Para Term Preterm AB Living   2 1 1  0 0 1   SAB TAB Ectopic Multiple Live Births   0 0 0 0 1     Past Medical History:  Diagnosis Date  . No pertinent past medical history    Past Surgical History:  Procedure Laterality Date  . NO PAST SURGERIES     Family History: family history includes Diabetes in her paternal aunt; Hypertension in her paternal grandmother. Social History:  reports that she has never smoked. She has never used smokeless tobacco. She reports that she does not drink alcohol or use drugs.     Maternal Diabetes: No Genetic Screening: Normal Maternal Ultrasounds/Referrals: Normal Fetal Ultrasounds or other Referrals:  None Maternal Substance Abuse:  No Significant Maternal Medications:  None Significant Maternal Lab Results:  Lab values include: Group B Strep positive Other Comments:  None  Plan Ampicillin to speed up dosing prior to delivery  Review of Systems  Constitutional: Negative for chills, fever and malaise/fatigue.  Respiratory: Negative for shortness of breath.   Gastrointestinal: Positive for abdominal pain. Negative for constipation, diarrhea, nausea and vomiting.  Genitourinary: Negative for dysuria.  Musculoskeletal: Negative for back pain.  Neurological: Negative for dizziness.   Maternal Medical History:  Reason for admission: Contractions.  Nausea.  Contractions: Onset was 6-12 hours ago.   Frequency: regular.   Perceived severity is strong.    Fetal activity: Perceived fetal activity is normal.   Last perceived fetal movement was within the past hour.    Prenatal complications: No bleeding, HIV, PIH, infection, placental abnormality, pre-eclampsia or preterm labor.   Prenatal Complications - Diabetes: none.    Dilation: 9 Effacement (%):  100 Station: -2 Exam by:: k fields, rn Blood pressure 133/82, pulse 93, temperature 97.2 F (36.2 C), temperature source Axillary, resp. rate 19, height 5\' 2"  (1.575 m), weight 179 lb (81.2 kg), last menstrual period 09/25/2015, SpO2 99 %, currently breastfeeding. Maternal Exam:  Uterine Assessment: Contraction strength is firm.  Contraction frequency is regular.   Abdomen: Patient reports no abdominal tenderness. Fundal height is 38.   Estimated fetal weight is 7.   Fetal presentation: vertex  Introitus: Normal vulva. Vagina is positive for vaginal discharge.  Ferning test: positive.  Nitrazine test: not done. Amniotic fluid character: clear.  Pelvis: adequate for delivery.   Cervix: Cervix evaluated by digital exam.     Fetal Exam Fetal Monitor Review: Mode: ultrasound.   Baseline rate: 140.  Variability: moderate (6-25 bpm).   Pattern: accelerations present and no decelerations.    Fetal State Assessment: Category I - tracings are normal.     Physical Exam  Constitutional: She is oriented to person, place, and time. She appears well-developed and well-nourished.  HENT:  Head: Normocephalic.  Cardiovascular: Normal rate and regular rhythm.   Respiratory: Effort normal and breath sounds normal. No respiratory distress. She has no wheezes. She has no rales.  GI: Soft. She exhibits no distension. There is no tenderness. There is no rebound and no guarding.  Genitourinary: Vaginal discharge found.  Musculoskeletal: Normal range of motion.  Neurological: She is alert and oriented to person, place, and time.  Skin: Skin is warm and dry.  Psychiatric: She has a normal mood and affect.    Prenatal labs:  ABO, Rh: O/Positive/-- (06/30 1553) Antibody: Negative (06/30 1553) Rubella: 3.09 (06/30 1553) RPR: Non Reactive (11/13 1015)  HBsAg: Negative (06/30 1553)  HIV: Non Reactive (11/13 1015)  GBS: Positive (12/29 0834)   Assessment/Plan: SIUP at [redacted]w[redacted]d  Active labor GBS  +  \Admit to [facility] Routine orders Ampicillin for GBS Anticipate SVD   Wynelle Bourgeois 07/08/2016, 1:40 PM

## 2016-07-08 NOTE — MAU Note (Signed)
Pt states she contractions throughout the night but could sleep through them.  Pt states at 0530 they got more regular and went she went to the bathroom she lost her mucus plug.  Pt states when she wiped there was blood a mucus and contractions were 10 minutes apart.  Pt states at 1000 they became 5 minutes apart.  Pt states she is feeling the baby move now.  Pt denies leaking of fluid like her water broke.

## 2016-07-09 LAB — RPR: RPR: NONREACTIVE

## 2016-07-09 NOTE — Lactation Note (Signed)
This note was copied from a baby's chart. Lactation Consultation Note  Patient Name: [name] ZOXWR'UToday's Date: 07/09/2016   Baby 32 hours old. Lights turned down and parents and baby sleeping. Patient's bedside nurse, Elon JesterMichele, RN reports that mom offering breast and bottle of formula, and baby nursing well.  Maternal Data    Feeding    LATCH Score/Interventions                      Lactation Tools Discussed/Used     Consult Status      Colleen Villegas 07/09/2016, 9:42 PM

## 2016-07-09 NOTE — Progress Notes (Signed)
Post Partum Day 1 Subjective: No problems or concerns. Patient states doing well. Reports minimal bleeding and pain controlled with pain medications. Denies calf pain.  Bottlefeeding/breastfeeding.    Objective: Blood pressure 113/61, pulse 67, temperature 98.8 F (37.1 C), temperature source Oral, resp. rate 16, height 5\' 2"  (1.575 m), weight 81.2 kg (179 lb), last menstrual period 09/25/2015, SpO2 99 %, unknown if currently breastfeeding.  Physical Exam:  General: alert, cooperative and appears stated age Lochia: appropriate Uterine Fundus: firm Incision: n/a DVT Evaluation: No evidence of DVT seen on physical exam. Negative Homan's sign.   Recent Labs  07/08/16 1230  HGB 13.1  HCT 37.9    Assessment/Plan: Plan for discharge tomorrow   LOS: 1 day   Colleen Villegas 07/09/2016, 7:50 AM

## 2016-07-09 NOTE — Progress Notes (Signed)
Post Partum Day 1  Subjective: No problems or concerns. Patient states doing well. Reports minimal bleeding and pain controlled with pain medications. Denies calf pain.  Breast/bottle.    Objective: Blood pressure 113/61, pulse 67, temperature 98.8 F (37.1 C), temperature source Oral, resp. rate 16, height 5\' 2"  (1.575 m), weight 81.2 kg (179 lb), last menstrual period 09/25/2015, SpO2 99 %, unknown if currently breastfeeding.  Physical Exam:  General: alert, cooperative and appears stated age Lochia: appropriate Uterine Fundus: firm Incision: n/a DVT Evaluation: No evidence of DVT seen on physical exam. Negative Homan's sign.   Recent Labs  07/08/16 1230  HGB 13.1  HCT 37.9    Assessment/Plan: Plan for discharge tomorrow   LOS: 1 day   Colleen Villegas 07/09/2016, 8:36 AM

## 2016-07-10 MED ORDER — NORETHINDRONE 0.35 MG PO TABS: 1.0000 | ORAL_TABLET | Freq: Every day | ORAL | 11 refills | Status: DC

## 2016-07-10 MED ORDER — IBUPROFEN 600 MG PO TABS: 600.0000 mg | ORAL_TABLET | Freq: Four times a day (QID) | ORAL | 0 refills | Status: DC

## 2016-07-10 NOTE — Lactation Note (Signed)
This note was copied from a baby's chart. Lactation Consultation Note  Patient Name: Colleen Villegas [id number]'s Date: 07/10/2016 Reason for consult: Follow-up assessment  Mom w/a compression stripe on tip of L nipple. R nipple with a small compression stripe plus a very small portion of the surface of the nipple is abraded. Comfort Gels provided w/instructions for use.   I offered to return & assist w/latching, but Mom declined, stating that she was getting ready to be d/c'd.   I provided regular Similac and explained that infant does not need to be on Alimentum. Mom does have a hand pump at home.   Consult Status Consult Status: Complete  Lurline HareRichey, Rya [facility]ch [facility] 07/10/2016, 10:25 AM

## 2016-07-10 NOTE — Plan of Care (Signed)
Problem: Education: Goal: Knowledge of condition will improve Discharge education reviewed with patient. Patient verbalizes understanding of information.

## 2016-07-10 NOTE — Discharge Summary (Signed)
OB Discharge Summary  Patient Name: Colleen Villegas DOB: Dec 24, 1990 MRN: [id number]  Date of admission: 07/08/2016 Delivering MD: Aviva Signs   Date of discharge: 07/10/2016  Admitting diagnosis: 39 WEEKS HAVING CONTRACTIONS Intrauterine pregnancy: [redacted]w[redacted]d     Secondary diagnosis:Active Problems:   Normal labor  Additional problems:none     Discharge diagnosis: Term Pregnancy Delivered                                                                     Post partum procedures:n/a  Augmentation: n/a  Complications: None  Hospital course:  Onset of Labor With Vaginal Delivery     26 y.o. yo [id number] at [redacted]w[redacted]d was admitted in Active Labor on 07/08/2016. Patient had an uncomplicated labor course as follows:  Membrane Rupture Time/Date: 11:55 AM ,07/08/2016   Intrapartum Procedures: Episiotomy: None [1]                                         Lacerations:  1st degree [2]  Patient had a delivery of a Viable infant. 07/08/2016  Information for the patient's newborn:  Raney, Antwine [[id number]]  Delivery Method: Vaginal, Spontaneous Delivery (Filed from Delivery Summary)    Pateint had an uncomplicated postpartum course.  She is ambulating, tolerating a regular diet, passing flatus, and urinating well. Patient is discharged home in stable condition on 07/10/16.   Physical exam  Vitals:   07/08/16 2055 07/09/16 0500 07/09/16 1735 07/10/16 0606  BP: 123/65 113/61 136/68 121/64  Pulse: 74 67 62 61  Resp: 18 16 18 18   Temp: 98.4 F (36.9 C) 98.8 F (37.1 C) 98.1 F (36.7 C) 97.8 F (36.6 C)  TempSrc: Oral Oral Oral Oral  SpO2:      Weight:      Height:       General: alert, cooperative and no distress Lochia: appropriate Uterine Fundus: firm Incision: N/A DVT Evaluation: No evidence of DVT seen on physical exam. Labs: Lab Results  Component Value Date   WBC 14.1 (H) 07/08/2016   HGB 13.1 07/08/2016   HCT 37.9 07/08/2016   MCV 80.0 07/08/2016   PLT 162  07/08/2016   CMP Latest Ref Rng & Units 05/25/2011  Glucose 70 - 99 mg/dL 85  BUN 6 - 23 mg/dL 7  Creatinine 1.30 - 8.65 mg/dL 7.84  Sodium 696 - 295 mEq/L 136  Potassium 3.5 - 5.1 mEq/L 4.2  Chloride 96 - 112 mEq/L 102  CO2 19 - 32 mEq/L 25  Calcium 8.4 - 10.5 mg/dL 28.4  Total Protein 6.0 - 8.3 g/dL 7.4  Total Bilirubin 0.3 - 1.2 mg/dL 1.3(K)  Alkaline Phos 39 - 117 U/L 65  AST 0 - 37 U/L 19  ALT 0 - 35 U/L 15    Discharge instruction: per After Visit Summary and "Baby and Me Booklet".  After Visit Meds:  Allergies as of 07/10/2016   No Known Allergies     Medication List    STOP taking these medications   acetaminophen 500 MG tablet Commonly known as:  TYLENOL     TAKE these medications  ibuprofen 600 MG tablet Commonly known as:  ADVIL,MOTRIN Take 1 tablet (600 mg total) by mouth every 6 (six) hours.   levocetirizine 5 MG tablet Commonly known as:  XYZAL Take 5 mg by mouth daily as needed for allergies.   prenatal multivitamin Tabs tablet Take 1 tablet by mouth at bedtime.       Diet: routine diet  Activity: Advance as tolerated. Pelvic rest for 6 weeks.   Outpatient follow up:6 weeks Follow up Appt:Future Appointments Date Time Provider Department Center  07/11/2016 1:15 PM Tereso NewcomerUgonna A Anyanwu, MD [facility] None   Follow up visit: No Follow-up on file.  Postpartum contraception: Progesterone only pills  Newborn Data: Live born female  Birth Weight: 7 lb 15.3 oz (3609 g) APGAR: 9, 9  Baby Feeding: Breast Disposition:home with mother   07/10/2016 Wyvonnia DuskyMarie Lawson, CNM

## 2016-07-11 ENCOUNTER — Encounter: Payer: Medicaid Other | Admitting: Obstetrics & Gynecology

## 2016-08-04 ENCOUNTER — Encounter: Payer: Self-pay | Admitting: Certified Nurse Midwife

## 2016-08-04 ENCOUNTER — Ambulatory Visit (INDEPENDENT_AMBULATORY_CARE_PROVIDER_SITE_OTHER): Payer: Medicaid Other | Admitting: Certified Nurse Midwife

## 2016-08-04 VITALS — BP 109/70 | HR 56 | Wt 161.0 lb

## 2016-08-04 DIAGNOSIS — R399 Unspecified symptoms and signs involving the genitourinary system: Secondary | ICD-10-CM

## 2016-08-04 LAB — POCT URINALYSIS DIPSTICK
Bilirubin, UA: NEGATIVE
GLUCOSE UA: NEGATIVE
KETONES UA: NEGATIVE
Leukocytes, UA: NEGATIVE
Nitrite, UA: NEGATIVE
Spec Grav, UA: <=1.005
UROBILINOGEN UA: NEGATIVE
pH, UA: 6.5

## 2016-08-04 MED ORDER — PHENAZOPYRIDINE HCL 200 MG PO TABS: 200.0000 mg | ORAL_TABLET | Freq: Three times a day (TID) | ORAL | 0 refills | Status: DC | PRN

## 2016-08-04 MED ORDER — NITROFURANTOIN MONOHYD MACRO 100 MG PO CAPS: 100.0000 mg | ORAL_CAPSULE | Freq: Two times a day (BID) | ORAL | 0 refills | Status: DC

## 2016-08-04 NOTE — Progress Notes (Signed)
Post Partum Exam  Colleen CollegeBrenda J Villegas is a 26 y.o. [id number] female who presents for a postpartum visit. She is 4 weeks postpartum following a spontaneous vaginal delivery. I have fully reviewed the prenatal and intrapartum course. The delivery was at 39 gestational weeks.  Anesthesia: none. Postpartum course has been doing well. Baby's course has been doing well. Baby is feeding by both breast and bottle - Similac Advance. Bleeding thin lochia. Bowel function is abnormal: occasional constipation. Bladder function is abnormal: possible UTI. Patient is not sexually active. Contraception method is abstinence.  Postpartum depression screening:neg, score 0.  The following portions of the patient's history were reviewed and updated as appropriate: allergies, current medications, past family history, past medical history, past social history, past surgical history and problem list.  Review of Systems Pertinent items noted in HPI and remainder of comprehensive ROS otherwise negative.    Objective:  unknown if currently breastfeeding.  General:  alert, cooperative and no distress   Breasts:  inspection negative, no nipple discharge or bleeding, no masses or nodularity palpable  Lungs: clear to auscultation bilaterally  Heart:  regular rate and rhythm, S1, S2 normal, no murmur, click, rub or gallop  Abdomen: soft, non-tender; bowel sounds normal; no masses,  no organomegaly  Corpus: normal  Pelvic Exam: Not performed.        Assessment:    Normal 4 week postpartum exam. Pap smear not done at today's visit.   Contraception counseling   Hematuria: probable UTI: Macrobid sent  Plan:   1. Contraception: oral progesterone-only contraceptive 2.  Last pap smear was 11/2015.  Urine culture for hematuria.  3. Follow up in: 4 months for annual exam or as needed.

## 2016-08-04 NOTE — Addendum Note (Signed)
Addended by: Marya LandryFOSTER, Kei Langhorst D on: 08/04/2016 11:41 AM   Modules accepted: Orders

## 2016-08-06 LAB — URINE CULTURE

## 2016-09-26 IMAGING — US US MFM OB COMP +14 WKS
1 series · 14 of 28 positions shown · non-contrast
Comparison: none

[Series 1: us mfm ob comp +14 wks · 14 of 127 slices shown]
[im 5/127]
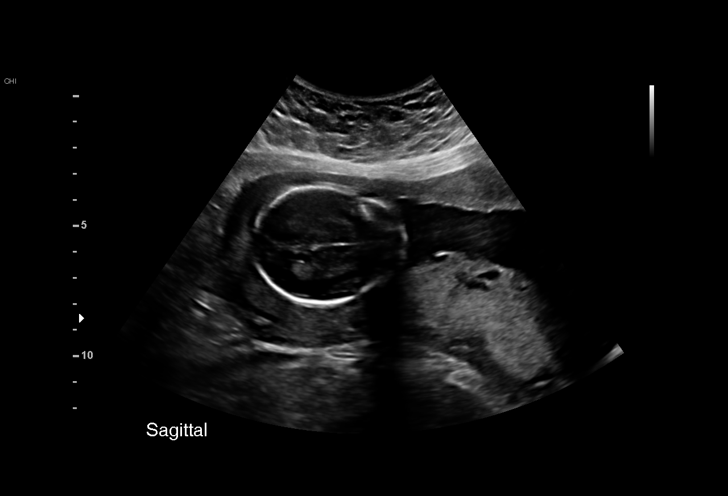
[im 15/127]
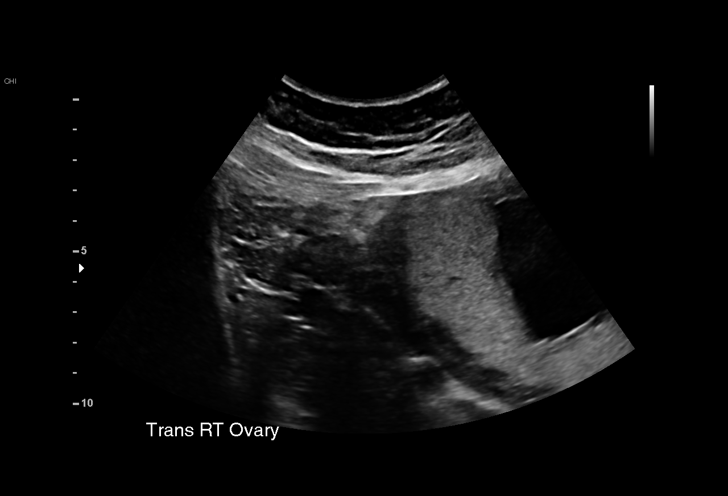
[im 24/127]
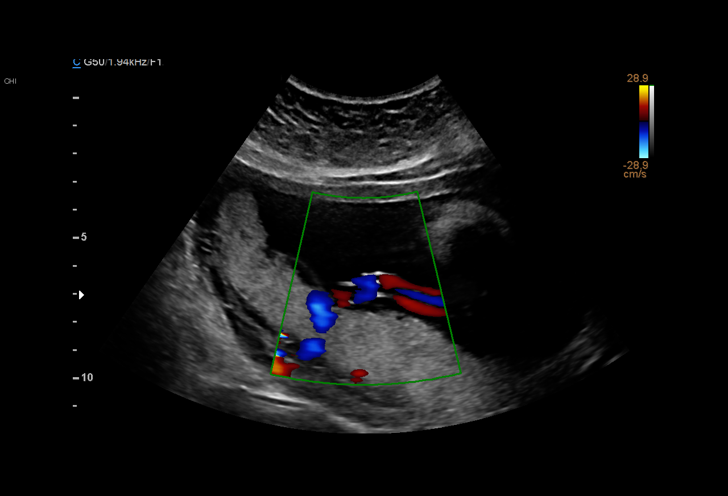
[im 33/127]
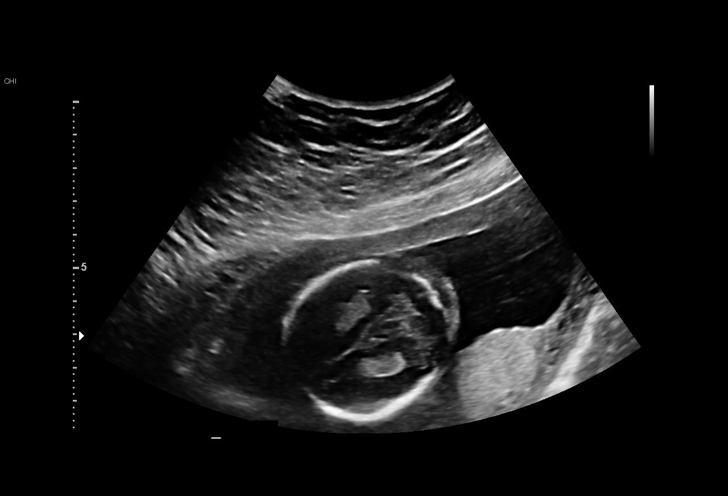
[im 43/127]
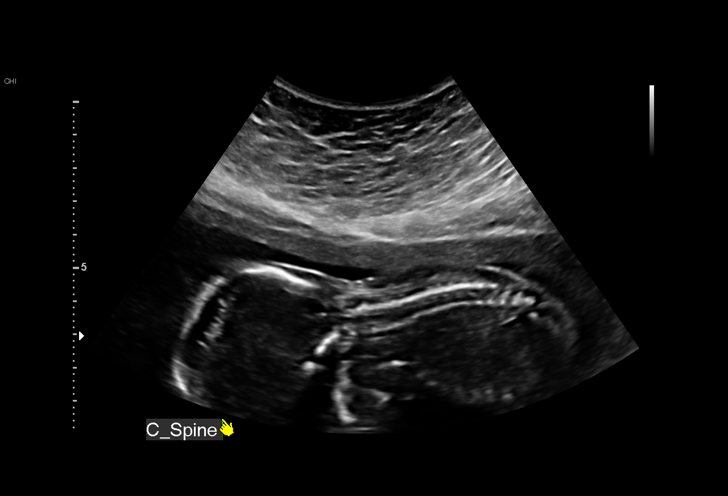
[im 52/127]
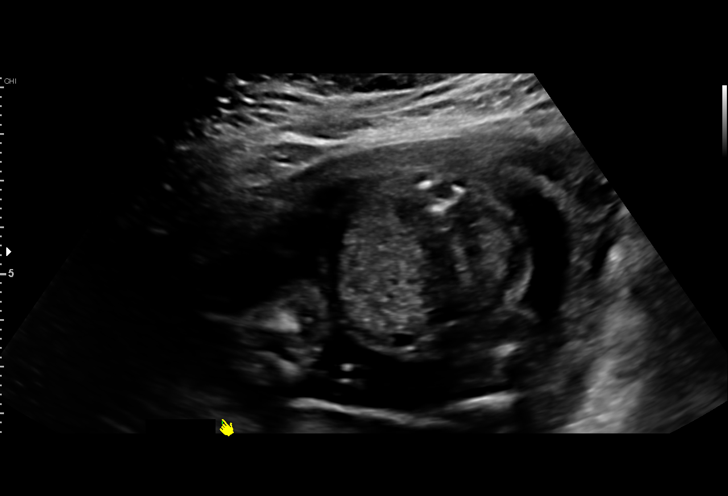
[im 61/127]
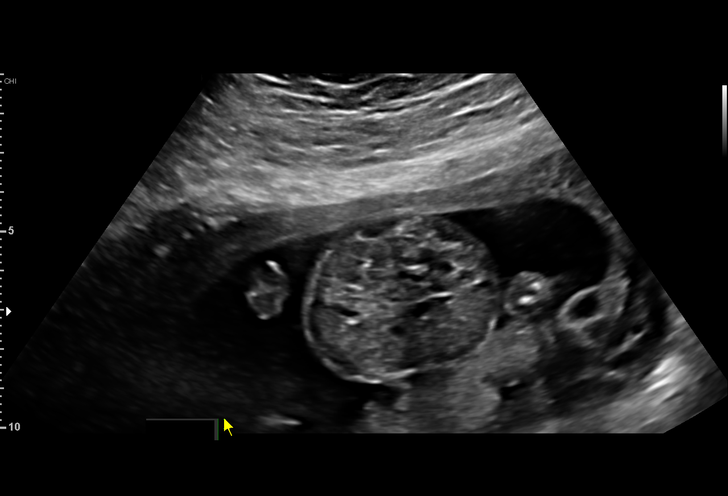
[im 71/127]
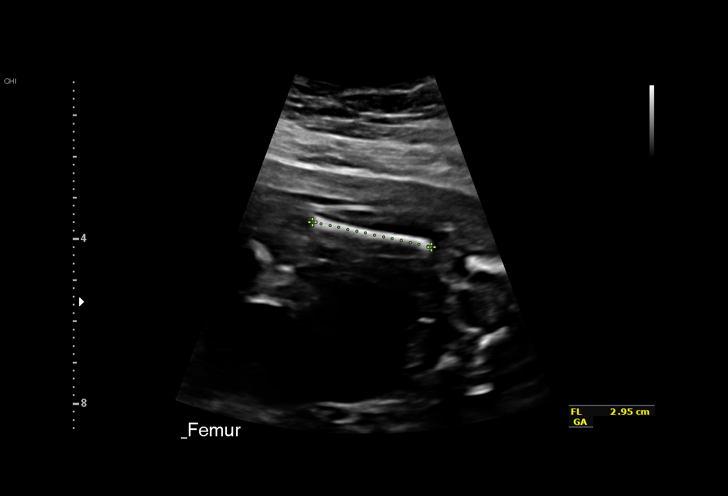
[im 80/127]
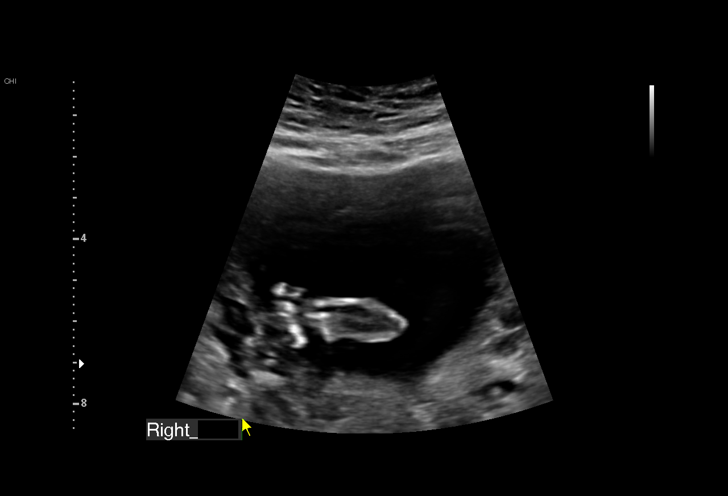
[im 89/127]
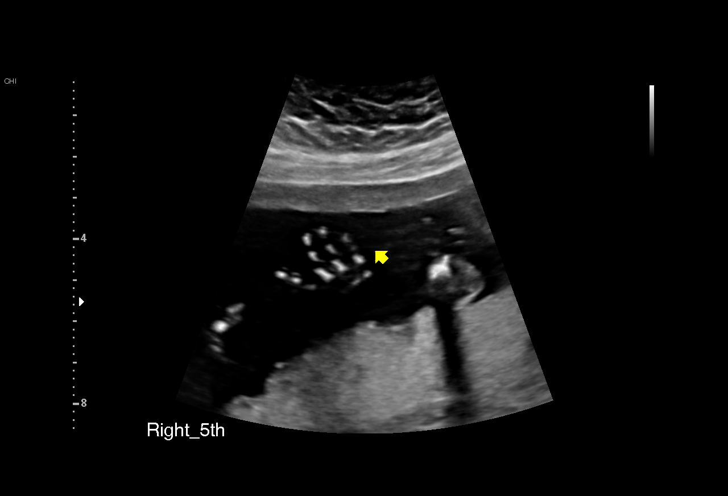
[im 99/127]
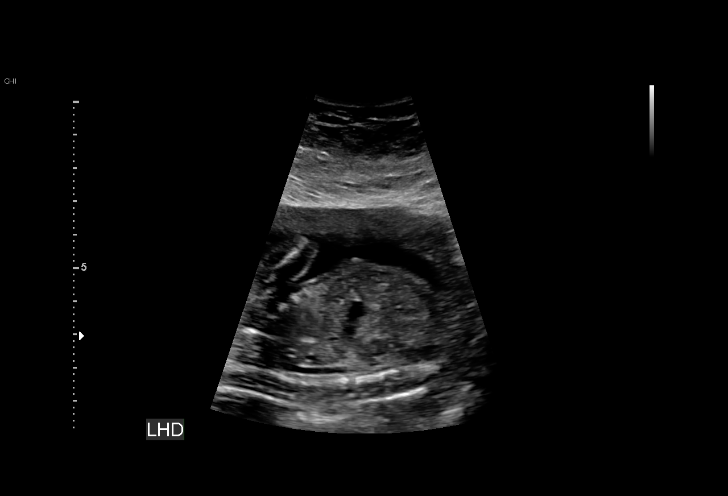
[im 108/127]
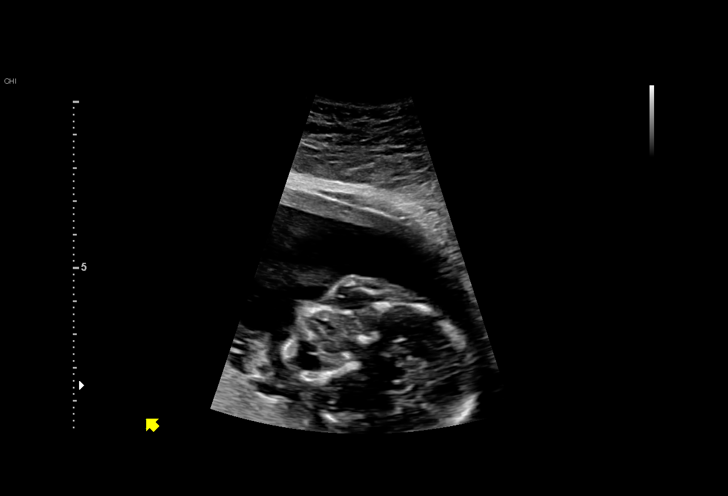
[im 117/127]
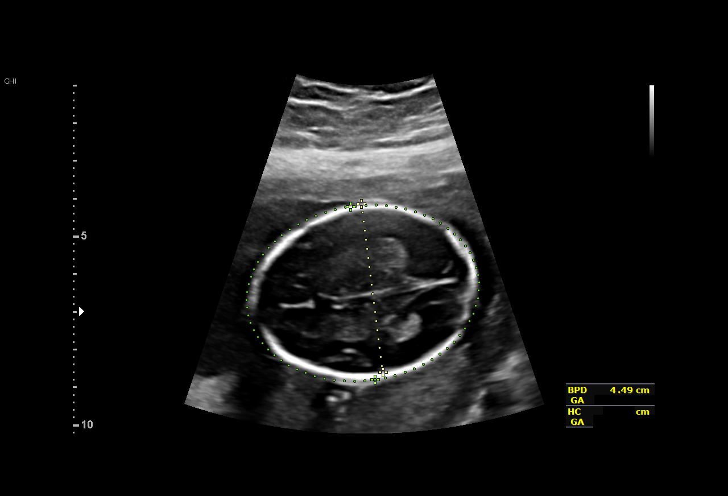
[im 127/127]
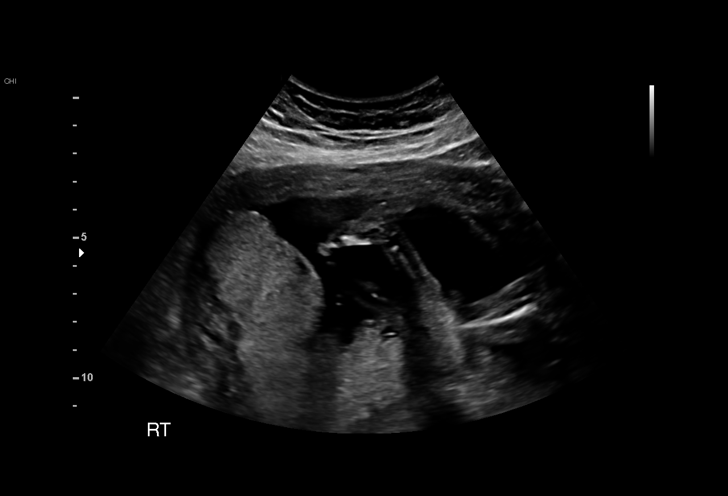

[14 of 28 positions shown; findings below may reference images not displayed]

Road [HOSPITAL]

Indications

19 weeks gestation of pregnancy
Basic anatomic survey                          Z36
OB History

Gravidity:    2         Term:   1
Fetal Evaluation

Num Of Fetuses:     1
Fetal Heart         127
Rate(bpm):
Cardiac Activity:   Observed
Presentation:       Breech
Placenta:           Posterior, above cervical os
P. Cord Insertion:  Visualized, central

Amniotic Fluid
AFI FV:      Subjectively within normal limits

Largest Pocket(cm)
6.1
Biometry

BPD:      45.2  mm     G. Age:  19w 5d         42  %    CI:        70.45   %   70 - 86
FL/HC:      17.8   %   16.8 -
HC:      171.7  mm     G. Age:  19w 5d         38  %    HC/AC:      1.11       1.09 -
AC:      155.2  mm     G. Age:  20w 5d         72  %    FL/BPD:     67.5   %
FL:       30.5  mm     G. Age:  19w 3d         28  %    FL/AC:      19.7   %   20 - 24
HUM:      30.8  mm     G. Age:  20w 2d         62  %
CER:      19.5  mm     G. Age:  18w 5d         22  %
NFT:       3.6  mm
CM:        5.3  mm

Est. FW:     330  gm    0 lb 12 oz      52  %
Gestational Age

Clinical EDD:  19w 6d                                        EDD:   07/11/16
U/S Today:     19w 6d                                        EDD:   07/11/16
Best:          19w 6d    Det. By:   Clinical EDD             EDD:   07/11/16
Anatomy

Cranium:               Appears normal         Aortic Arch:            Not well visualized
Cavum:                 Appears normal         Ductal Arch:            Not well visualized
Ventricles:            Appears normal         Diaphragm:              Appears normal
Choroid Plexus:        Appears normal         Stomach:                Appears normal, left
sided
Cerebellum:            Appears normal         Abdomen:                Appears normal
Posterior Fossa:       Appears normal         Abdominal Wall:         Appears nml (cord
insert, abd wall)
Nuchal Fold:           Appears normal         Cord Vessels:           Appears normal (3
vessel cord)
Face:                  Appears normal         Kidneys:                Appear normal
(orbits and profile)
Lips:                  Appears normal         Bladder:                Appears normal
Thoracic:              Appears normal         Spine:                  Appears normal
Heart:                 Appears normal         Upper Extremities:      Appears normal
(4CH, axis, and situs
RVOT:                  Not well visualized    Lower Extremities:      Appears normal
LVOT:                  Appears normal

Other:  Fetus appears to be a female. Heels and 5th digit visualized.
Technically difficult due to fetal position.  Unable to optain optimal
views of the ductal and aortic arches and the RVOT.
Cervix Uterus Adnexa

Cervix
Length:           3.96  cm.
Normal appearance by transabdominal scan.

Uterus
No abnormality visualized.

Left Ovary
Within normal limits.

Right Ovary
Within normal limits.

Cul De Sac:   No free fluid seen.

Adnexa:       No abnormality visualized.
Impression

SIUP at 19+6 weeks
Normal detailed fetal anatomy; limited views of heart
Markers of aneuploidy: none
Normal amniotic fluid volume
Measurements consistent with stated EDC
Recommendations

Follow-up ultrasound in 4-6 weeks to complete anatomy
survey

## 2017-03-12 ENCOUNTER — Other Ambulatory Visit: Payer: Self-pay | Admitting: Certified Nurse Midwife

## 2017-08-23 ENCOUNTER — Encounter: Payer: Self-pay | Admitting: Physician Assistant

## 2017-08-23 ENCOUNTER — Ambulatory Visit: Payer: Self-pay | Admitting: Physician Assistant

## 2017-08-23 ENCOUNTER — Other Ambulatory Visit: Payer: Self-pay

## 2017-08-23 VITALS — BP 108/64 | HR 63 | Temp 98.3°F | Ht 62.5 in | Wt 165.6 lb

## 2017-08-23 DIAGNOSIS — T3695XA Adverse effect of unspecified systemic antibiotic, initial encounter: Secondary | ICD-10-CM

## 2017-08-23 DIAGNOSIS — Z7689 Persons encountering health services in other specified circumstances: Secondary | ICD-10-CM

## 2017-08-23 DIAGNOSIS — N898 Other specified noninflammatory disorders of vagina: Secondary | ICD-10-CM

## 2017-08-23 DIAGNOSIS — B379 Candidiasis, unspecified: Secondary | ICD-10-CM

## 2017-08-23 DIAGNOSIS — R82998 Other abnormal findings in urine: Secondary | ICD-10-CM

## 2017-08-23 DIAGNOSIS — R300 Dysuria: Secondary | ICD-10-CM

## 2017-08-23 LAB — POCT URINALYSIS DIP (MANUAL ENTRY)
BILIRUBIN UA: NEGATIVE mg/dL
Bilirubin, UA: NEGATIVE
Glucose, UA: NEGATIVE mg/dL
Nitrite, UA: NEGATIVE
PH UA: 6 (ref 5.0–8.0)
PROTEIN UA: NEGATIVE mg/dL
SPEC GRAV UA: 1.01 (ref 1.010–1.025)
UROBILINOGEN UA: 0.2 U/dL

## 2017-08-23 LAB — POCT WET + KOH PREP
TRICH BY WET PREP: ABSENT
Yeast by KOH: ABSENT
Yeast by wet prep: ABSENT

## 2017-08-23 MED ORDER — NITROFURANTOIN MONOHYD MACRO 100 MG PO CAPS: 100.0000 mg | ORAL_CAPSULE | Freq: Two times a day (BID) | ORAL | 0 refills | Status: AC

## 2017-08-23 MED ORDER — FLUCONAZOLE 150 MG PO TABS: 150.0000 mg | ORAL_TABLET | Freq: Once | ORAL | 0 refills | Status: AC

## 2017-08-23 NOTE — Progress Notes (Signed)
08/23/2017 at 6:46 PM  Colleen Villegas / DOB: 1990/07/05 / MRN: [id number]  The patient has Supervision of other normal pregnancy, antepartum on their problem list.  SUBJECTIVE  Colleen Villegas is a 27 y.o. female who complains of dysuria, urinary frequency, hesitancy, genital irritation, and vaginal itching x 3 days.  Notes it all started with vaginal itching and then she picked up an over-the-counter yeast medication.  The itching and discharge Better but then she developed dysuria, urinary frequency, and hesitancy. She denies hematuria, flank pain and genital rash. Has tried OTC yeast infection medication with no full relief. Most recent UTI prior to this was years ago. She is sexually active with monogamous partner. LMP 07/26/17. Cycles are irregular. Notes there is no chance she could be pregnant.    Patient would also like to establish care.  She does not have a current primary care provider.  She has no chronic medical conditions is not on any chronic medications.  She  has a past medical history of No pertinent past medical history.    Medications reviewed and updated by myself where necessary, and exist elsewhere in the encounter.   Colleen Villegas has No Known Allergies. She  reports that  has never smoked. she has never used smokeless tobacco. She reports that she does not drink alcohol or use drugs. She  reports that she currently engages in sexual activity. She reports using the following method of birth control/protection: None. The patient  has a past surgical history that includes No past surgeries.  Her family history includes Diabetes in her paternal aunt; Hypertension in her paternal grandmother.  Review of Systems  Constitutional: Negative for chills and fever.  Gastrointestinal: Negative for nausea and vomiting.    OBJECTIVE  Her  height is 5' 2.5" (1.588 m) and weight is 165 lb 9.6 oz (75.1 kg). Her oral temperature is 98.3 F (36.8 C). Her blood pressure is 108/64 and  her pulse is 63. Her oxygen saturation is 100%.  The patient's body mass index is 29.81 kg/m.  Physical Exam  Constitutional: She is oriented to person, place, and time. She appears well-developed and well-nourished.  HENT:  Head: Normocephalic and atraumatic.  Eyes: Conjunctivae are normal.  Neck: Normal range of motion.  Pulmonary/Chest: Effort normal.  Abdominal: Normal appearance. There is tenderness (mild) in the suprapubic area.  Neurological: She is alert and oriented to person, place, and time.  Skin: Skin is warm and dry.  Psychiatric: She has a normal mood and affect.  Vitals reviewed.   Results for orders placed or performed in visit on 08/23/17 (from the past 24 hour(s))  POCT urinalysis dipstick     Status: Abnormal   Collection Time: 08/23/17  2:09 PM  Result Value Ref Range   Color, UA yellow yellow   Clarity, UA clear clear   Glucose, UA negative negative mg/dL   Bilirubin, UA negative negative   Ketones, POC UA negative negative mg/dL   Spec Grav, UA 4.098 1.191 - 1.025   Blood, UA trace-intact (A) negative   pH, UA 6.0 5.0 - 8.0   Protein Ur, POC negative negative mg/dL   Urobilinogen, UA 0.2 0.2 or 1.0 E.U./dL   Nitrite, UA Negative Negative   Leukocytes, UA Trace (A) Negative  POCT Wet + KOH Prep     Status: Abnormal   Collection Time: 08/23/17  2:32 PM  Result Value Ref Range   Yeast by KOH Absent Absent   Yeast by  wet prep Absent Absent   WBC by wet prep Few Few   Clue Cells Wet Prep HPF POC None None   Trich by wet prep Absent Absent   Bacteria Wet Prep HPF POC Few Few   Epithelial Cells By Principal FinancialWet Pref ([facility]) Moderate (A) None, Few, Too numerous to count   RBC,UR,HPF,POC Moderate (A) None RBC/hpf    ASSESSMENT & PLAN  Colleen DroneBrenda was seen today for back pain.  Diagnoses and all orders for this visit:  Dysuria -     POCT urinalysis dipstick  Vaginal itching -     POCT Wet + KOH Prep  Leukocytes in urine -     nitrofurantoin,  macrocrystal-monohydrate, (MACROBID) 100 MG capsule; Take 1 capsule (100 mg total) by mouth 2 (two) times daily for 5 days.  Antibiotic-induced yeast infection -     fluconazole (DIFLUCAN) 150 MG tablet; Take 1 tablet (150 mg total) by mouth once for 1 dose. Repeat after you finish antibiotic course.  Encounter to establish care   Will treat empirically for UTI at this time due to symptoms and point-of-care findings.  Patient is requesting to not send off a urine culture because she is self-pay.  Will treat with Macrobid at this time.  Given Rx for Diflucan to use for antibiotic-induced yeast infection.The patient was advised to call or come back to clinic if she does not see an improvement in symptoms, or worsens with the above plan.  Informed her that if she returns with urinary symptoms, we will likely have to collect a urine culture at that time.  Patient understands and agrees treatment.  We are also happy to act as patient's primary care office.  Benjiman CoreBrittany Kaimana Lurz, PA-C  [facility] 08/23/2017 6:46 PM

## 2017-08-23 NOTE — Patient Instructions (Addendum)
Your results indicate you have a UTI. I have given you a prescription for an antibiotic. Please take with food. If your symptoms worsen while or you develop fever, chills, flank pian, nausea and vomiting, please seek care immediately.   I have given you two tablets for yeast infection. Take one now and one after you finish antibiotic. Thank you for letting me participate in your health and well being.  Urinary Tract Infection, Adult A urinary tract infection (UTI) is an infection of any part of the urinary tract. The urinary tract includes the:  Kidneys.  Ureters.  Bladder.  Urethra.  These organs make, store, and get rid of pee (urine) in the body. Follow these instructions at home:  Take over-the-counter and prescription medicines only as told by your doctor.  If you were prescribed an antibiotic medicine, take it as told by your doctor. Do not stop taking the antibiotic even if you start to feel better.  Avoid the following drinks: ? Alcohol. ? Caffeine. ? Tea. ? Carbonated drinks.  Drink enough fluid to keep your pee clear or pale yellow.  Keep all follow-up visits as told by your doctor. This is important.  Make sure to: ? Empty your bladder often and completely. Do not to hold pee for long periods of time. ? Empty your bladder before and after sex. ? Wipe from front to back after a bowel movement if you are female. Use each tissue one time when you wipe. Contact a doctor if:  You have back pain.  You have a fever.  You feel sick to your stomach (nauseous).  You throw up (vomit).  Your symptoms do not get better after 3 days.  Your symptoms go away and then come back. Get help right away if:  You have very bad back pain.  You have very bad lower belly (abdominal) pain.  You are throwing up and cannot keep down any medicines or water. This information is not intended to replace advice given to you by your health care provider. Make sure you discuss any  questions you have with your health care provider. Document Released: 11/15/2007 Document Revised: 11/04/2015 Document Reviewed: 04/19/2015 Elsevier Interactive Patient Education  2018 [employer]     IF you received an x-ray today, you will receive an invoice from [facility]. Please contact [facility] at [phone] with questions or concerns regarding your invoice.   IF you received labwork today, you will receive an invoice from [facility]. Please contact [facility] at [phone] with questions or concerns regarding your invoice.   Our billing staff will not be able to assist you with questions regarding bills from these companies.  You will be contacted with the lab results as soon as they are available. The fastest way to get your results is to activate your My Chart account. Instructions are located on the last page of this paperwork. If you have not heard from us regarding the results in 2 weeks, please contact this office.

## 2020-02-11 ENCOUNTER — Encounter: Payer: Self-pay | Admitting: Emergency Medicine

## 2020-02-11 ENCOUNTER — Other Ambulatory Visit: Payer: Self-pay

## 2020-02-11 ENCOUNTER — Ambulatory Visit (INDEPENDENT_AMBULATORY_CARE_PROVIDER_SITE_OTHER): Payer: Self-pay | Admitting: Emergency Medicine

## 2020-02-11 VITALS — BP 120/75 | HR 56 | Temp 98.3°F | Ht 62.0 in | Wt 175.2 lb

## 2020-02-11 DIAGNOSIS — Z1329 Encounter for screening for other suspected endocrine disorder: Secondary | ICD-10-CM

## 2020-02-11 DIAGNOSIS — Z1321 Encounter for screening for nutritional disorder: Secondary | ICD-10-CM

## 2020-02-11 DIAGNOSIS — Z130 Encounter for screening for diseases of the blood and blood-forming organs and certain disorders involving the immune mechanism: Secondary | ICD-10-CM

## 2020-02-11 DIAGNOSIS — Z13220 Encounter for screening for lipoid disorders: Secondary | ICD-10-CM

## 2020-02-11 DIAGNOSIS — Z7689 Persons encountering health services in other specified circumstances: Secondary | ICD-10-CM

## 2020-02-11 DIAGNOSIS — Z13228 Encounter for screening for other metabolic disorders: Secondary | ICD-10-CM

## 2020-02-11 NOTE — Progress Notes (Signed)
Colleen Villegas 29 y.o.   Chief Complaint  Patient presents with  . Transitions Of Care    wants a cpe and i have informed her she will need to make a new visit for it  . Stress    she felt weird due to stress, heart racing   . Weight Gain  . Allergic Rhinitis     HISTORY OF PRESENT ILLNESS: This is a 29 y.o. female here to establish care with me.  Used to see PA Timmothy Euler. 3 months ago had episode of headache and feeling tired with rapid heartbeat.  Went away after couple of hours. Reports some weight gain. Has history of allergic rhinitis responsive to over-the-counter antihistamines. Reports family history of diabetes.  Interested in getting blood work done. No other complaints or medical concerns today.  HPI   Prior to Admission medications   Medication Sig Start Date End Date Taking? Authorizing Provider  Desloratadine-Pseudoephedrine (CLARINEX-D 12 HOUR PO) Take by mouth.   Yes [provider]    No Known Allergies  There are no problems to display for this patient.   Past Medical History:  Diagnosis Date  . No pertinent past medical history     Past Surgical History:  Procedure Laterality Date  . NO PAST SURGERIES      Social History   Socioeconomic History  . Marital status: Married    Spouse name: Not on file  . Number of children: Not on file  . Years of education: Not on file  . Highest education level: Not on file  Occupational History  . Not on file  Tobacco Use  . Smoking status: Never Smoker  . Smokeless tobacco: Never Used  Substance and Sexual Activity  . Alcohol use: No  . Drug use: No  . Sexual activity: Yes    Birth control/protection: None  Other Topics Concern  . Not on file  Social History Narrative  . Not on file   Social Determinants of Health   Financial Resource Strain:   . Difficulty of Paying Living Expenses: Not on file  Food Insecurity:   . Worried About Charity fundraiser in the Last Year: Not on file   . Ran Out of Food in the Last Year: Not on file  Transportation Needs:   . Lack of Transportation (Medical): Not on file  . Lack of Transportation (Non-Medical): Not on file  Physical Activity:   . Days of Exercise per Week: Not on file  . Minutes of Exercise per Session: Not on file  Stress:   . Feeling of Stress : Not on file  Social Connections:   . Frequency of Communication with Friends and Family: Not on file  . Frequency of Social Gatherings with Friends and Family: Not on file  . Attends Religious Services: Not on file  . Active Member of Clubs or Organizations: Not on file  . Attends Archivist Meetings: Not on file  . Marital Status: Not on file  Intimate Partner Violence:   . Fear of Current or Ex-Partner: Not on file  . Emotionally Abused: Not on file  . Physically Abused: Not on file  . Sexually Abused: Not on file    Family History  Problem Relation Age of Onset  . Diabetes Paternal Aunt   . Hypertension Paternal Grandmother   . Anesthesia problems Neg Hx      Review of Systems  Constitutional: Negative.  Negative for chills and fever.  HENT: Negative.  Negative for congestion and sore throat.   Respiratory: Negative.  Negative for cough and shortness of breath.   Cardiovascular: Negative.  Negative for chest pain and palpitations.  Gastrointestinal: Negative.  Negative for abdominal pain, diarrhea, nausea and vomiting.  Genitourinary: Negative.  Negative for dysuria and hematuria.  Musculoskeletal: Negative.  Negative for back pain, myalgias and neck pain.  Skin: Negative.  Negative for rash.  Neurological: Negative.  Negative for dizziness and headaches.  All other systems reviewed and are negative.   Today's Vitals   02/11/20 1334  BP: 120/75  Pulse: (!) 56  Temp: 98.3 F (36.8 C)  TempSrc: Temporal  SpO2: 96%  Weight: 175 lb 3.2 oz (79.5 kg)  Height: $Remove'5\' 2"'NTJShqA$  (1.575 m)   Body mass index is 32.04 kg/m.  Physical Exam Vitals  reviewed.  Constitutional:      Appearance: Normal appearance.  HENT:     Head: Normocephalic.  Eyes:     Extraocular Movements: Extraocular movements intact.     Pupils: Pupils are equal, round, and reactive to light.  Cardiovascular:     Rate and Rhythm: Normal rate and regular rhythm.     Pulses: Normal pulses.     Heart sounds: Normal heart sounds.  Pulmonary:     Effort: Pulmonary effort is normal.     Breath sounds: Normal breath sounds.  Musculoskeletal:        General: Normal range of motion.     Cervical back: Normal range of motion and neck supple.  Skin:    General: Skin is warm and dry.     Capillary Refill: Capillary refill takes less than 2 seconds.  Neurological:     General: No focal deficit present.     Mental Status: She is alert and oriented to person, place, and time.  Psychiatric:        Mood and Affect: Mood normal.        Behavior: Behavior normal.      ASSESSMENT & PLAN: Colleen Villegas was seen today for transitions of care, stress, weight gain and allergic rhinitis .  Diagnoses and all orders for this visit:  Encounter to establish care  Screening for endocrine, nutritional, metabolic and immunity disorder -     CMP14+EGFR -     Hemoglobin A1c -     TSH  Screening for deficiency anemia -     CBC with Differential/Platelet  Screening for lipoid disorders -     Lipid panel    Patient Instructions       If you have lab work done today you will be contacted with your lab results within the next 2 weeks.  If you have not heard from [place] then please contact us. The fastest way to get your results is to register for My Chart.   IF you received an x-ray today, you will receive an invoice from [facility]. Please contact [facility] at [phone] with questions or concerns regarding your invoice.   IF you received labwork today, you will receive an invoice from [facility]. Please contact [facility] at [phone] with questions or  concerns regarding your invoice.   Our billing staff will not be able to assist you with questions regarding bills from these companies.  You will be contacted with the lab results as soon as they are available. The fastest way to get your results is to activate your My Chart account. Instructions are located on the last page of this paperwork. If you have not heard from  [place] regarding the results in 2 weeks, please contact this office.      Health Maintenance, Female Adopting a healthy lifestyle and getting preventive care are important in promoting health and wellness. Ask your health care provider about:  The right schedule for you to have regular tests and exams.  Things you can do on your own to prevent diseases and keep yourself healthy. What should I know about diet, weight, and exercise? Eat a healthy diet   Eat a diet that includes plenty of vegetables, fruits, low-fat dairy products, and lean protein.  Do not eat a lot of foods that are high in solid fats, added sugars, or sodium. Maintain a healthy weight Body mass index (BMI) is used to identify weight problems. It estimates body fat based on height and weight. Your health care provider can help determine your BMI and help you achieve or maintain a healthy weight. Get regular exercise Get regular exercise. This is one of the most important things you can do for your health. Most adults should:  Exercise for at least 150 minutes each week. The exercise should increase your heart rate and make you sweat (moderate-intensity exercise).  Do strengthening exercises at least twice a week. This is in addition to the moderate-intensity exercise.  Spend less time sitting. Even light physical activity can be beneficial. Watch cholesterol and blood lipids Have your blood tested for lipids and cholesterol at 28 years of age, then have this test every 5 years. Have your cholesterol levels checked more often if:  Your lipid or  cholesterol levels are high.  You are older than 28 years of age.  You are at high risk for heart disease. What should I know about cancer screening? Depending on your health history and family history, you may need to have cancer screening at various ages. This may include screening for:  Breast cancer.  Cervical cancer.  Colorectal cancer.  Skin cancer.  Lung cancer. What should I know about heart disease, diabetes, and high blood pressure? Blood pressure and heart disease  High blood pressure causes heart disease and increases the risk of stroke. This is more likely to develop in people who have high blood pressure readings, are of African descent, or are overweight.  Have your blood pressure checked: ? Every 3-5 years if you are 49-52 years of age. ? Every year if you are 49 years old or older. Diabetes Have regular diabetes screenings. This checks your fasting blood sugar level. Have the screening done:  Once every three years after age 22 if you are at a normal weight and have a low risk for diabetes.  More often and at a younger age if you are overweight or have a high risk for diabetes. What should I know about preventing infection? Hepatitis B If you have a higher risk for hepatitis B, you should be screened for this virus. Talk with your health care provider to find out if you are at risk for hepatitis B infection. Hepatitis C Testing is recommended for:  Everyone born from [birth year] through 1965.  Anyone with known risk factors for hepatitis C. Sexually transmitted infections (STIs)  Get screened for STIs, including gonorrhea and chlamydia, if: ? You are sexually active and are younger than 28 years of age. ? You are older than 28 years of age and your health care provider tells you that you are at risk for this type of infection. ? Your sexual activity has changed since you were last screened,  and you are at increased risk for chlamydia or gonorrhea. Ask your  health care provider if you are at risk.  Ask your health care provider about whether you are at high risk for HIV. Your health care provider may recommend a prescription medicine to help prevent HIV infection. If you choose to take medicine to prevent HIV, you should first get tested for HIV. You should then be tested every 3 months for as long as you are taking the medicine. Pregnancy  If you are about to stop having your period (premenopausal) and you may become pregnant, seek counseling before you get pregnant.  Take 400 to 800 micrograms (mcg) of folic acid every day if you become pregnant.  Ask for birth control (contraception) if you want to prevent pregnancy. Osteoporosis and menopause Osteoporosis is a disease in which the bones lose minerals and strength with aging. This can result in bone fractures. If you are 47 years old or older, or if you are at risk for osteoporosis and fractures, ask your health care provider if you should:  Be screened for bone loss.  Take a calcium or vitamin D supplement to lower your risk of fractures.  Be given hormone replacement therapy (HRT) to treat symptoms of menopause. Follow these instructions at home: Lifestyle  Do not use any products that contain nicotine or tobacco, such as cigarettes, e-cigarettes, and chewing tobacco. If you need help quitting, ask your health care provider.  Do not use street drugs.  Do not share needles.  Ask your health care provider for help if you need support or information about quitting drugs. Alcohol use  Do not drink alcohol if: ? Your health care provider tells you not to drink. ? You are pregnant, may be pregnant, or are planning to become pregnant.  If you drink alcohol: ? Limit how much you use to 0-1 drink a day. ? Limit intake if you are breastfeeding.  Be aware of how much alcohol is in your drink. In the [place] one drink equals one 12 oz bottle of beer (355 mL), one 5 oz glass of wine (148  mL), or one 1 oz glass of hard liquor (44 mL). General instructions  Schedule regular health, dental, and eye exams.  Stay current with your vaccines.  Tell your health care provider if: ? You often feel depressed. ? You have ever been abused or do not feel safe at home. Summary  Adopting a healthy lifestyle and getting preventive care are important in promoting health and wellness.  Follow your health care provider's instructions about healthy diet, exercising, and getting tested or screened for diseases.  Follow your health care provider's instructions on monitoring your cholesterol and blood pressure. This information is not intended to replace advice given to you by your health care provider. Make sure you discuss any questions you have with your health care provider. Document Revised: 05/22/2018 Document Reviewed: 05/22/2018 Elsevier Patient Education  2020 Elsevier Inc.       Agustina Caroli, MD [facility]

## 2020-02-11 NOTE — Patient Instructions (Addendum)
   If you have lab work done today you will be contacted with your lab results within the next 2 weeks.  If you have not heard from us then please contact us. The fastest way to get your results is to register for My Chart.   IF you received an x-ray today, you will receive an invoice from [facility]. Please contact [facility] at [phone] with questions or concerns regarding your invoice.   IF you received labwork today, you will receive an invoice from [facility]. Please contact [facility] at [phone] with questions or concerns regarding your invoice.   Our billing staff will not be able to assist you with questions regarding bills from these companies.  You will be contacted with the lab results as soon as they are available. The fastest way to get your results is to activate your My Chart account. Instructions are located on the last page of this paperwork. If you have not heard from us regarding the results in 2 weeks, please contact this office.       Health Maintenance, Female Adopting a healthy lifestyle and getting preventive care are important in promoting health and wellness. Ask your health care provider about:  The right schedule for you to have regular tests and exams.  Things you can do on your own to prevent diseases and keep yourself healthy. What should I know about diet, weight, and exercise? Eat a healthy diet   Eat a diet that includes plenty of vegetables, fruits, low-fat dairy products, and lean protein.  Do not eat a lot of foods that are high in solid fats, added sugars, or sodium. Maintain a healthy weight Body mass index (BMI) is used to identify weight problems. It estimates body fat based on height and weight. Your health care provider can help determine your BMI and help you achieve or maintain a healthy weight. Get regular exercise Get regular exercise. This is one of the most important things you can do for your health. Most  adults should:  Exercise for at least 150 minutes each week. The exercise should increase your heart rate and make you sweat (moderate-intensity exercise).  Do strengthening exercises at least twice a week. This is in addition to the moderate-intensity exercise.  Spend less time sitting. Even light physical activity can be beneficial. Watch cholesterol and blood lipids Have your blood tested for lipids and cholesterol at 28 years of age, then have this test every 5 years. Have your cholesterol levels checked more often if:  Your lipid or cholesterol levels are high.  You are older than 28 years of age.  You are at high risk for heart disease. What should I know about cancer screening? Depending on your health history and family history, you may need to have cancer screening at various ages. This may include screening for:  Breast cancer.  Cervical cancer.  Colorectal cancer.  Skin cancer.  Lung cancer. What should I know about heart disease, diabetes, and high blood pressure? Blood pressure and heart disease  High blood pressure causes heart disease and increases the risk of stroke. This is more likely to develop in people who have high blood pressure readings, are of African descent, or are overweight.  Have your blood pressure checked: ? Every 3-5 years if you are 18-39 years of age. ? Every year if you are 40 years old or older. Diabetes Have regular diabetes screenings. This checks your fasting blood sugar level. Have the screening done:  Once   every three years after age 40 if you are at a normal weight and have a low risk for diabetes.  More often and at a younger age if you are overweight or have a high risk for diabetes. What should I know about preventing infection? Hepatitis B If you have a higher risk for hepatitis B, you should be screened for this virus. Talk with your health care provider to find out if you are at risk for hepatitis B infection. Hepatitis  C Testing is recommended for:  Everyone born from 1945 through 1965.  Anyone with known risk factors for hepatitis C. Sexually transmitted infections (STIs)  Get screened for STIs, including gonorrhea and chlamydia, if: ? You are sexually active and are younger than 28 years of age. ? You are older than 28 years of age and your health care provider tells you that you are at risk for this type of infection. ? Your sexual activity has changed since you were last screened, and you are at increased risk for chlamydia or gonorrhea. Ask your health care provider if you are at risk.  Ask your health care provider about whether you are at high risk for HIV. Your health care provider may recommend a prescription medicine to help prevent HIV infection. If you choose to take medicine to prevent HIV, you should first get tested for HIV. You should then be tested every 3 months for as long as you are taking the medicine. Pregnancy  If you are about to stop having your period (premenopausal) and you may become pregnant, seek counseling before you get pregnant.  Take 400 to 800 micrograms (mcg) of folic acid every day if you become pregnant.  Ask for birth control (contraception) if you want to prevent pregnancy. Osteoporosis and menopause Osteoporosis is a disease in which the bones lose minerals and strength with aging. This can result in bone fractures. If you are 65 years old or older, or if you are at risk for osteoporosis and fractures, ask your health care provider if you should:  Be screened for bone loss.  Take a calcium or vitamin D supplement to lower your risk of fractures.  Be given hormone replacement therapy (HRT) to treat symptoms of menopause. Follow these instructions at home: Lifestyle  Do not use any products that contain nicotine or tobacco, such as cigarettes, e-cigarettes, and chewing tobacco. If you need help quitting, ask your health care provider.  Do not use street  drugs.  Do not share needles.  Ask your health care provider for help if you need support or information about quitting drugs. Alcohol use  Do not drink alcohol if: ? Your health care provider tells you not to drink. ? You are pregnant, may be pregnant, or are planning to become pregnant.  If you drink alcohol: ? Limit how much you use to 0-1 drink a day. ? Limit intake if you are breastfeeding.  Be aware of how much alcohol is in your drink. In the [place] one drink equals one 12 oz bottle of beer (355 mL), one 5 oz glass of wine (148 mL), or one 1 oz glass of hard liquor (44 mL). General instructions  Schedule regular health, dental, and eye exams.  Stay current with your vaccines.  Tell your health care provider if: ? You often feel depressed. ? You have ever been abused or do not feel safe at home. Summary  Adopting a healthy lifestyle and getting preventive care are important in promoting health and   wellness.  Follow your health care provider's instructions about healthy diet, exercising, and getting tested or screened for diseases.  Follow your health care provider's instructions on monitoring your cholesterol and blood pressure. This information is not intended to replace advice given to you by your health care provider. Make sure you discuss any questions you have with your health care provider. Document Revised: 05/22/2018 Document Reviewed: 05/22/2018 Elsevier Patient Education  2020 [employer] Inc.  

## 2020-02-12 LAB — LIPID PANEL
Chol/HDL Ratio: 3.6 ratio (ref 0.0–4.4)
Cholesterol, Total: 153 mg/dL (ref 100–199)
HDL: 42 mg/dL (ref >39)
LDL Chol Calc (NIH): 93 mg/dL (ref 0–99)
Triglycerides: 99 mg/dL (ref 0–149)
VLDL Cholesterol Cal: 18 mg/dL (ref 5–40)

## 2020-02-12 LAB — CBC WITH DIFFERENTIAL/PLATELET
Basophils Absolute: 0 10*3/uL (ref 0.0–0.2)
Basos: 0 %
EOS (ABSOLUTE): 0.1 10*3/uL (ref 0.0–0.4)
Eos: 2 %
Hematocrit: 38.5 % (ref 34.0–46.6)
Hemoglobin: 12.4 g/dL (ref 11.1–15.9)
Immature Grans (Abs): 0 10*3/uL (ref 0.0–0.1)
Immature Granulocytes: 0 %
Lymphocytes Absolute: 3.1 10*3/uL (ref 0.7–3.1)
Lymphs: 34 %
MCH: 26.8 pg (ref 26.6–33.0)
MCHC: 32.2 g/dL (ref 31.5–35.7)
MCV: 83 fL (ref 79–97)
Monocytes Absolute: 0.6 10*3/uL (ref 0.1–0.9)
Monocytes: 7 %
Neutrophils Absolute: 5.2 10*3/uL (ref 1.4–7.0)
Neutrophils: 57 %
Platelets: 229 10*3/uL (ref 150–450)
RBC: 4.63 x10E6/uL (ref 3.77–5.28)
RDW: 14.3 % (ref 11.7–15.4)
WBC: 9.1 10*3/uL (ref 3.4–10.8)

## 2020-02-12 LAB — TSH: TSH: 1.4 u[IU]/mL (ref 0.450–4.500)

## 2020-02-12 LAB — CMP14+EGFR
ALT: 35 IU/L — ABNORMAL HIGH (ref 0–32)
AST: 23 IU/L (ref 0–40)
Albumin/Globulin Ratio: 1.5 (ref 1.2–2.2)
Albumin: 4.3 g/dL (ref 3.9–5.0)
Alkaline Phosphatase: 87 IU/L (ref 48–121)
BUN/Creatinine Ratio: 16 (ref 9–23)
BUN: 10 mg/dL (ref 6–20)
Bilirubin Total: 0.3 mg/dL (ref 0.0–1.2)
CO2: 23 mmol/L (ref 20–29)
Calcium: 9.4 mg/dL (ref 8.7–10.2)
Chloride: 102 mmol/L (ref 96–106)
Creatinine, Ser: 0.62 mg/dL (ref 0.57–1.00)
GFR calc Af Amer: 142 mL/min/{1.73_m2} (ref >59)
GFR calc non Af Amer: 123 mL/min/{1.73_m2} (ref >59)
Globulin, Total: 2.9 g/dL (ref 1.5–4.5)
Glucose: 81 mg/dL (ref 65–99)
Potassium: 4.4 mmol/L (ref 3.5–5.2)
Sodium: 140 mmol/L (ref 134–144)
Total Protein: 7.2 g/dL (ref 6.0–8.5)

## 2020-02-12 LAB — HEMOGLOBIN A1C
Est. average glucose Bld gHb Est-mCnc: 108 mg/dL
Hgb A1c MFr Bld: 5.4 % (ref 4.8–5.6)

## 2022-06-01 ENCOUNTER — Ambulatory Visit: Payer: Medicaid Other | Admitting: Obstetrics

## 2022-06-07 ENCOUNTER — Ambulatory Visit: Payer: Medicaid Other | Admitting: Obstetrics

## 2022-07-05 ENCOUNTER — Telehealth: Payer: Self-pay

## 2022-07-05 NOTE — Telephone Encounter (Signed)
Mychart msg sent. AS, CMA 

## 2022-08-13 ENCOUNTER — Encounter (HOSPITAL_COMMUNITY): Payer: Self-pay | Admitting: *Deleted

## 2022-08-13 ENCOUNTER — Ambulatory Visit (HOSPITAL_COMMUNITY)
Admission: EM | Admit: 2022-08-13 | Discharge: 2022-08-13 | Disposition: A | Discharge status: Home / Self Care | Payer: Medicaid Other | Attending: Emergency Medicine | Admitting: Emergency Medicine

## 2022-08-13 ENCOUNTER — Other Ambulatory Visit: Payer: Self-pay

## 2022-08-13 DIAGNOSIS — R6889 Other general symptoms and signs: Secondary | ICD-10-CM

## 2022-08-13 LAB — POCT RAPID STREP A, ED / UC: Streptococcus, Group A Screen (Direct): NEGATIVE

## 2022-08-13 MED ORDER — OSELTAMIVIR PHOSPHATE 75 MG PO CAPS: 75.0000 mg | ORAL_CAPSULE | Freq: Two times a day (BID) | ORAL | 0 refills | Status: DC

## 2022-08-13 MED ORDER — NYSTATIN 100000 UNIT/ML MT SUSP: 5.0000 mL | Freq: Four times a day (QID) | OROMUCOSAL | 0 refills | Status: DC | PRN

## 2022-08-13 NOTE — Discharge Instructions (Signed)
Your symptoms today are most likely being caused by a virus and should steadily improve in time it can take up to 7 to 10 days before you truly start to see a turnaround however things will get better  As we are unable to test you for the flu, will treat you prophylactically as you are having high fevers, begin Tamiflu every morning and every evening for 5 days, this helps to reduce this amount of virus in your body which helps to minimize your symptoms  Strep testing is negative for bacteria    You can take Tylenol and/or Ibuprofen as needed for fever reduction and pain relief.   For cough: honey 1/2 to 1 teaspoon (you can dilute the honey in water or another fluid).  You can also use guaifenesin and dextromethorphan for cough. You can use a humidifier for chest congestion and cough.  If you don't have a humidifier, you can sit in the bathroom with the hot shower running.      For sore throat: try warm salt water gargles, cepacol lozenges, throat spray, warm tea or water with lemon/honey, popsicles or ice, or OTC cold relief medicine for throat discomfort.   For congestion: take a daily anti-histamine like Zyrtec, Claritin, and a oral decongestant, such as pseudoephedrine.  You can also use Flonase 1-2 sprays in each nostril daily.   It is important to stay hydrated: drink plenty of fluids (water, gatorade/powerade/pedialyte, juices, or teas) to keep your throat moisturized and help further relieve irritation/discomfort.

## 2022-08-13 NOTE — ED Triage Notes (Signed)
Pt reports cough ,sore throat,chills started Friday.

## 2022-08-13 NOTE — ED Provider Notes (Signed)
Nettie    CSN: [id number] Arrival date & time: 08/13/22  1037      History   Chief Complaint Chief Complaint  Patient presents with   Cough   Sore Throat   Fever   Chills    HPI ADVITHA FINTON is a 32 y.o. female.   Patient presents for evaluation of recommendation addition, rhinorrhea, sore throat and a nonproductive cough present for 2 days.  Bilateral eyes feel heavy but denies erythema, pruritus or drainage.  Has felt as if there is fluid inside of the right ear but denies pain.  Fever peaking at 102.  Possible sick contacts at church.  Decreased appetite today.  Has attempted use of Tylenol and over-the-counter cough medication.  Denies respiratory history, non-smoker.   Past Medical History:  Diagnosis Date   No pertinent past medical history     There are no problems to display for this patient.   Past Surgical History:  Procedure Laterality Date   NO PAST SURGERIES      OB History     Gravida  2   Para  2   Term  2   Preterm  0   AB  0   Living  2      SAB  0   IAB  0   Ectopic  0   Multiple  0   Live Births  2            Home Medications    Prior to Admission medications   Medication Sig Start Date End Date Taking? Authorizing Provider  Desloratadine-Pseudoephedrine (CLARINEX-D 12 HOUR PO) Take by mouth.    [provider]    Family History Family History  Problem Relation Age of Onset   Hypertension Paternal Grandmother    Diabetes Paternal Aunt    Anesthesia problems Neg Hx     Social History Social History   Tobacco Use   Smoking status: Never   Smokeless tobacco: Never  Substance Use Topics   Alcohol use: No   Drug use: No     Allergies   Patient has no known allergies.   Review of Systems Review of Systems  Constitutional:  Positive for fever. Negative for activity change, appetite change, chills, diaphoresis, fatigue and unexpected weight change.  HENT:  Positive for  congestion, rhinorrhea and sore throat. Negative for dental problem, drooling, ear discharge, ear pain, facial swelling, hearing loss, mouth sores, nosebleeds, postnasal drip, sinus pressure, sinus pain, sneezing, tinnitus, trouble swallowing and voice change.   Respiratory:  Positive for cough. Negative for apnea, choking, chest tightness, shortness of breath, wheezing and stridor.   Cardiovascular: Negative.   Gastrointestinal: Negative.   Skin: Negative.   Neurological: Negative.      Physical Exam Triage Vital Signs ED Triage Vitals  Enc Vitals Group     BP 08/13/22 1147 (!) 136/91     Pulse Rate 08/13/22 1147 (!) 105     Resp 08/13/22 1147 18     Temp 08/13/22 1147 (!) 100.9 F (38.3 C)     Temp src --      SpO2 08/13/22 1147 98 %     Weight --      Height --      Head Circumference --      Peak Flow --      Pain Score 08/13/22 1144 7     Pain Loc --      Pain Edu? --  Excl. in GC? --    No data found.  Updated Vital Signs BP (!) 136/91   Pulse (!) 105   Temp (!) 100.9 F (38.3 C)   Resp 18   LMP 07/27/2022   SpO2 98%   Visual Acuity Right Eye Distance:   Left Eye Distance:   Bilateral Distance:    Right Eye Near:   Left Eye Near:    Bilateral Near:     Physical Exam Constitutional:      Appearance: Normal appearance. She is well-developed.  HENT:     Head: Normocephalic.     Right Ear: Tympanic membrane, ear canal and external ear normal.     Left Ear: Tympanic membrane, ear canal and external ear normal.     Nose: Congestion and rhinorrhea present.     Mouth/Throat:     Mouth: Mucous membranes are moist.     Pharynx: No posterior oropharyngeal erythema.     Tonsils: No tonsillar exudate. 0 on the right. 0 on the left.  Eyes:     Extraocular Movements: Extraocular movements intact.  Cardiovascular:     Rate and Rhythm: Normal rate and regular rhythm.     Pulses: Normal pulses.     Heart sounds: Normal heart sounds.  Pulmonary:     Effort:  Pulmonary effort is normal.     Breath sounds: Normal breath sounds.  Skin:    General: Skin is warm and dry.  Neurological:     Mental Status: She is alert and oriented to person, place, and time. Mental status is at baseline.  Psychiatric:        Mood and Affect: Mood normal.        Behavior: Behavior normal.      UC Treatments / Results  Labs (all labs ordered are listed, but only abnormal results are displayed) Labs Reviewed  POCT RAPID STREP A, ED / UC  POC INFLUENZA A AND B ANTIGEN (URGENT CARE ONLY)    EKG   Radiology No results found.  Procedures Procedures (including critical care time)  Medications Ordered in UC Medications - No data to display  Initial Impression / Assessment and Plan / UC Course  I have reviewed the triage vital signs and the nursing notes.  Pertinent labs & imaging results that were available during my care of the patient were reviewed by me and considered in my medical decision making (see chart for details).  Flulike symptoms  Fever of 100.9 noted intact triage with associated tachycardia, patient while ill-appearing is in no signs of distress, unable to test for influenza today, strep testing negative, discussed with patient and significant other, treating prophylactically, Tamiflu prescribed as well as Magic mouthwash as sore throat is most worrisome symptom, may use additional over-the-counter medications for supportive care with urgent care follow-up as needed Final Clinical Impressions(s) / UC Diagnoses   Final diagnoses:  None   Discharge Instructions   None    ED Prescriptions   None    PDMP not reviewed this encounter.   Hans Eden, NP 08/13/22 [phone]

## 2022-08-22 ENCOUNTER — Ambulatory Visit (INDEPENDENT_AMBULATORY_CARE_PROVIDER_SITE_OTHER): Payer: Medicaid Other | Admitting: Nurse Practitioner

## 2022-08-22 ENCOUNTER — Encounter: Payer: Self-pay | Admitting: Nurse Practitioner

## 2022-08-22 VITALS — BP 120/72 | HR 67 | Ht 62.5 in | Wt 180.0 lb

## 2022-08-22 DIAGNOSIS — N8189 Other female genital prolapse: Secondary | ICD-10-CM | POA: Diagnosis not present

## 2022-08-22 DIAGNOSIS — N943 Premenstrual tension syndrome: Secondary | ICD-10-CM

## 2022-08-22 DIAGNOSIS — J3089 Other allergic rhinitis: Secondary | ICD-10-CM | POA: Diagnosis not present

## 2022-08-22 DIAGNOSIS — J0140 Acute pansinusitis, unspecified: Secondary | ICD-10-CM | POA: Diagnosis not present

## 2022-08-22 MED ORDER — ALBUTEROL SULFATE HFA 108 (90 BASE) MCG/ACT IN AERS: 1.0000 | INHALATION_SPRAY | Freq: Four times a day (QID) | RESPIRATORY_TRACT | 6 refills | Status: DC | PRN

## 2022-08-22 MED ORDER — LEVOCETIRIZINE DIHYDROCHLORIDE 5 MG PO TABS: 5.0000 mg | ORAL_TABLET | Freq: Every evening | ORAL | 1 refills | Status: DC

## 2022-08-22 NOTE — Progress Notes (Signed)
Orma Render, DNP, AGNP-c [facility] [address] [place], [place] [address] [place] [phone]   New patient visit   Patient: Colleen Villegas   DOB: 10/03/1990   32 y.o. Female  MRN: [id number] Visit Date: 08/22/2022  Patient Care Team: Orma Render, NP as PCP - General (Nurse Practitioner)  Today's Vitals   08/22/22 1118  BP: 120/72  Pulse: 67  Weight: 180 lb (81.6 kg)  Height: 5' 2.5" (1.588 m)   Body mass index is 32.4 kg/m.   Today's healthcare provider: Orma Render, NP   Chief Complaint  Patient presents with   other    New pt. Est. Does have seasonal allergies, took alkaseltzer for sinus and congestion today.    Subjective    Colleen Villegas is a 32 y.o. female who presents today as a new patient to establish care.    Patient endorses the following concerns presently: Colleen Villegas presents today to establish care, detailing her recent health concerns, primarily focusing on sinus allergies. She recounts a recent bout with the flu, noting that while the primary symptoms have subsided, she continues to have residual effects, including exacerbated allergies. Colleen Villegas describes waking up with headaches and has resorted to over-the-counter sinus and congestion medications for relief. She mentions the presence of green mucus and ongoing congestion. She has been using Allegra for allergy management.   She experiences increased pressure in her sinuses when bending over and indicates that coughing without the aid of medication leads to a throbbing pain in her head. Colleen Villegas also reports a sore throat, localized tenderness on one side, and a persistent cough. She recalls having a fever a couple of weeks ago and notes a slight wheeze in her lungs.  Colleen Villegas expresses concerns about experiencing mood swings during her menstrual cycle. She is currently taking a supplement blend that includes magnesium, St. John's Wort, ginkgo biloba, safe transport, and  dandelion. Additionally, Colleen Villegas uses chamomile tea as a remedy for anxiety and irritation.  Colleen Villegas also discusses experiencing stress incontinence, particularly noticeable after childbirth, leading to urine leakage during activities such as coughing and sneezing.  History reviewed and reveals the following: Past Medical History:  Diagnosis Date   No pertinent past medical history    Past Surgical History:  Procedure Laterality Date   NO PAST SURGERIES     Family Status  Relation Name Status   Mother  Alive   Father  Alive   PGM  (Not Specified)   Ethlyn Daniels  (Not Specified)   Neg Hx  (Not Specified)   Family History  Problem Relation Age of Onset   Hypertension Paternal Grandmother    Diabetes Paternal Aunt    Anesthesia problems Neg Hx    Social History   Socioeconomic History   Marital status: Married    Spouse name: Not on file   Number of children: Not on file   Years of education: Not on file   Highest education level: Not on file  Occupational History   Not on file  Tobacco Use   Smoking status: Never   Smokeless tobacco: Never  Substance and Sexual Activity   Alcohol use: No   Drug use: No   Sexual activity: Yes    Birth control/protection: None  Other Topics Concern   Not on file  Social History Narrative   Not on file   Social Determinants of Health   Financial Resource Strain: Not on file  Food Insecurity: Not  on file  Transportation Needs: Not on file  Physical Activity: Not on file  Stress: Not on file  Social Connections: Not on file   Outpatient Medications Prior to Visit  Medication Sig   [DISCONTINUED] fexofenadine (ALLEGRA) 60 MG tablet Take 60 mg by mouth 2 (two) times daily.   [DISCONTINUED] Desloratadine-Pseudoephedrine (CLARINEX-D 12 HOUR PO) Take by mouth. (Patient not taking: Reported on 08/22/2022)   [DISCONTINUED] magic mouthwash (nystatin, lidocaine, diphenhydrAMINE, alum & mag hydroxide) suspension Swish and spit 5 mLs 4 (four)  times daily as needed for mouth pain. (Patient not taking: Reported on 08/22/2022)   [DISCONTINUED] oseltamivir (TAMIFLU) 75 MG capsule Take 1 capsule (75 mg total) by mouth every 12 (twelve) hours. (Patient not taking: Reported on 08/22/2022)   No facility-administered medications prior to visit.   No Known Allergies Immunization History  Administered Date(s) Administered   Influenza,inj,Quad PF,6+ Mos 03/10/2016   Tdap 04/24/2016    Health Maintenance Due Health Maintenance Topics with due status: Overdue     Topic Date Due   PAP SMEAR-Modifier 12/10/2018    Review of Systems All review of systems negative except what is listed in the HPI   Objective    BP 120/72   Pulse 67   Ht 5' 2.5" (1.588 m)   Wt 180 lb (81.6 kg)   LMP 07/27/2022   BMI 32.40 kg/m  Physical Exam Vitals and nursing note reviewed.  Constitutional:      Appearance: Normal appearance.  HENT:     Head: Normocephalic.     Right Ear: A middle ear effusion is present.     Left Ear: Tympanic membrane normal.     Nose: Congestion and rhinorrhea present.     Mouth/Throat:     Mouth: Mucous membranes are moist.     Pharynx: Posterior oropharyngeal erythema present.  Eyes:     Pupils: Pupils are equal, round, and reactive to light.  Neck:     Vascular: No carotid bruit.  Cardiovascular:     Rate and Rhythm: Normal rate and regular rhythm.     Pulses: Normal pulses.     Heart sounds: Normal heart sounds.  Pulmonary:     Effort: Pulmonary effort is normal.     Breath sounds: Normal breath sounds. No wheezing.  Abdominal:     General: Bowel sounds are normal.     Palpations: Abdomen is soft.  Musculoskeletal:        General: Normal range of motion.  Lymphadenopathy:     Cervical: Cervical adenopathy present.  Skin:    General: Skin is warm and dry.     Capillary Refill: Capillary refill takes less than 2 seconds.  Neurological:     General: No focal deficit present.     Mental Status: She is  alert.  Psychiatric:        Mood and Affect: Mood normal.     No results found for any visits on 08/22/22.  Assessment & Plan      Problem List Items Addressed This Visit     Acute non-recurrent pansinusitis - Primary    Siarrah presents with symptoms indicative of a sinus infection, including green mucus, congestion, and headaches, pressure and pain upon bending over, alongside a history of flu-like symptoms a few weeks ago. Examination reveals fluid in the ears and a slight wheeze in the lungs, likely due to inflammation or irritation from allergies.  Plan: - Switch allergy medication to Xyzal (Levocetirizine) for bedtime use.  - Start  Fluticasone (Flonase) nasal spray to address inflammation and congestion, with instructions on proper usage provided. - Albuterol inhaler prescribed for wheezing, to be kept on hand for seasonal changes. - Note that sinus infections may resolve independently; however, symptoms should be monitored closely. Antibiotics may be considered if there is no improvement.       Relevant Medications   levocetirizine (XYZAL) 5 MG tablet   PMS (premenstrual syndrome)    Hassan Rowan experiences mood swings during her period, attributed to common hormonal changes. She currently utilizes a supplement blend including magnesium, St. John's Wort, ginkgo biloba, safe transport, and dandelion for mood stabilization. Review of the supplement list does not reveal any concerning ingredients based on her current health status.   Plan: - I recommend Chamomile tea as a natural remedy to aid relaxation and improve sleep if feeling anxious or irritated. - You may continue with the supplement, however, if you begin to notice any new symptoms please stop this immediately. Also stop if you begin any new medications until interactions can be monitored.       Environmental and seasonal allergies    Dottie experiences chronic seasonal allergies.  Plan: - start levocitirazine for  management.       Relevant Medications   levocetirizine (XYZAL) 5 MG tablet   albuterol (VENTOLIN HFA) 108 (90 Base) MCG/ACT inhaler   Pelvic floor weakness in female    Semeka reports experiencing stress incontinence with symptoms exacerbated by coughing and sneezing. She has no UTI sx.  Plan: - I recommend pelvic floor exercises to strengthen muscles and potentially improve symptoms. Handout provided - We can consider PT referral if this is not helpful.         Return for already scheduled.      Man Effertz, Coralee Pesa, NP, DNP, AGNP-C [facility]

## 2022-08-22 NOTE — Patient Instructions (Signed)
Try fluticasone (Flonase) nasal spray to help with your allergy symptoms- this can be purchased over the counter.   If your symptoms are not better in the next couple of days, please let me know.   I have sent in the allergy medication and the albuterol inhaler for you to use.

## 2022-08-23 MED ORDER — AMOXICILLIN-POT CLAVULANATE 875-125 MG PO TABS: 1.0000 | ORAL_TABLET | Freq: Two times a day (BID) | ORAL | 0 refills | Status: DC

## 2022-08-23 MED ORDER — FLUCONAZOLE 150 MG PO TABS: ORAL_TABLET | ORAL | 1 refills | Status: DC

## 2022-08-25 DIAGNOSIS — N8189 Other female genital prolapse: Secondary | ICD-10-CM | POA: Insufficient documentation

## 2022-08-25 DIAGNOSIS — J0140 Acute pansinusitis, unspecified: Secondary | ICD-10-CM | POA: Insufficient documentation

## 2022-08-25 DIAGNOSIS — N943 Premenstrual tension syndrome: Secondary | ICD-10-CM | POA: Insufficient documentation

## 2022-08-25 DIAGNOSIS — J3089 Other allergic rhinitis: Secondary | ICD-10-CM | POA: Insufficient documentation

## 2022-08-25 HISTORY — DX: Acute pansinusitis, unspecified: J01.40

## 2022-08-25 NOTE — Assessment & Plan Note (Signed)
Zachariah presents with symptoms indicative of a sinus infection, including green mucus, congestion, and headaches, pressure and pain upon bending over, alongside a history of flu-like symptoms a few weeks ago. Examination reveals fluid in the ears and a slight wheeze in the lungs, likely due to inflammation or irritation from allergies.  Plan: - Switch allergy medication to Xyzal (Levocetirizine) for bedtime use.  - Start Fluticasone (Flonase) nasal spray to address inflammation and congestion, with instructions on proper usage provided. - Albuterol inhaler prescribed for wheezing, to be kept on hand for seasonal changes. - Note that sinus infections may resolve independently; however, symptoms should be monitored closely. Antibiotics may be considered if there is no improvement.

## 2022-08-25 NOTE — Assessment & Plan Note (Signed)
Hassan Rowan experiences mood swings during her period, attributed to common hormonal changes. She currently utilizes a supplement blend including magnesium, St. John's Wort, ginkgo biloba, safe transport, and dandelion for mood stabilization. Review of the supplement list does not reveal any concerning ingredients based on her current health status.   Plan: - I recommend Chamomile tea as a natural remedy to aid relaxation and improve sleep if feeling anxious or irritated. - You may continue with the supplement, however, if you begin to notice any new symptoms please stop this immediately. Also stop if you begin any new medications until interactions can be monitored.

## 2022-08-25 NOTE — Assessment & Plan Note (Signed)
Colleen Villegas reports experiencing stress incontinence with symptoms exacerbated by coughing and sneezing. She has no UTI sx.  Plan: - I recommend pelvic floor exercises to strengthen muscles and potentially improve symptoms. Handout provided - We can consider PT referral if this is not helpful.

## 2022-08-25 NOTE — Assessment & Plan Note (Signed)
Colleen Villegas experiences chronic seasonal allergies.  Plan: - start levocitirazine for management.

## 2022-09-15 ENCOUNTER — Other Ambulatory Visit (HOSPITAL_COMMUNITY)
Admission: RE | Admit: 2022-09-15 | Discharge: 2022-09-15 | Disposition: A | Discharge status: Home / Self Care | Payer: Medicaid Other | Source: Ambulatory Visit | Attending: Nurse Practitioner | Admitting: Nurse Practitioner

## 2022-09-15 ENCOUNTER — Encounter: Payer: Self-pay | Admitting: Nurse Practitioner

## 2022-09-15 ENCOUNTER — Ambulatory Visit: Payer: Medicaid Other | Admitting: Nurse Practitioner

## 2022-09-15 VITALS — BP 110/70 | HR 64 | Ht 62.5 in | Wt 182.4 lb

## 2022-09-15 DIAGNOSIS — D509 Iron deficiency anemia, unspecified: Secondary | ICD-10-CM

## 2022-09-15 DIAGNOSIS — R7303 Prediabetes: Secondary | ICD-10-CM

## 2022-09-15 DIAGNOSIS — Z0000 Encounter for general adult medical examination without abnormal findings: Secondary | ICD-10-CM

## 2022-09-15 DIAGNOSIS — J3089 Other allergic rhinitis: Secondary | ICD-10-CM

## 2022-09-15 DIAGNOSIS — I83813 Varicose veins of bilateral lower extremities with pain: Secondary | ICD-10-CM

## 2022-09-15 DIAGNOSIS — Z124 Encounter for screening for malignant neoplasm of cervix: Secondary | ICD-10-CM | POA: Insufficient documentation

## 2022-09-15 DIAGNOSIS — E559 Vitamin D deficiency, unspecified: Secondary | ICD-10-CM

## 2022-09-15 LAB — LIPID PANEL

## 2022-09-15 NOTE — Progress Notes (Signed)
Shawna Clamp, DNP, AGNP-c [facility] [address] [place], [place] [address] Main Office [phone]  BP 110/70   Pulse 64   Ht 5' 2.5" (1.588 m)   Wt 182 lb 6.4 oz (82.7 kg)   LMP 08/19/2022   BMI 32.83 kg/m    Subjective:    Patient ID: Colleen Villegas, female    DOB: 1991-02-20, 31 y.o.   MRN: [id number]  HPI: Colleen Villegas is a 32 y.o. female presenting on 09/15/2022 for comprehensive medical examination.    Current medical concerns include: Colleen Villegas has additional concerns of allergies, which she attributes to an increase in pollen. She reports that her sinus infection from last month has resolved, but she is currently experiencing allergic symptoms, including waking up feeling stuffy in the mornings despite taking Xyzal at night as prescribed. Colleen Villegas is considering trying Claritin in addition to or instead of Xyzal to see if it improves her symptoms.  Colleen Villegas also expresses concerns about spider veins, noting that they become painful when she stands for extended periods. She is interested in potential treatments for her spider veins, including cosmetic procedures.  Regarding her menstrual cycle, Colleen Villegas indicates that her periods are regular, with a cycle length of 30 to 32 days, which she considers normal for herself.  ROS negative with exception of what is listed in the HPI  IMMUNIZATIONS:   Flu: Flu vaccine completed elsewhere this season Prevnar 13: Prevnar 13 N/A for this patient Prevnar 20: Prevnar 20 N/A for this patient Pneumovax 23: Pneumovax 23 N/A for this patient Vac Shingrix: Shingrix N/A for this patient HPV: HPV N/A for this patient Tetanus: Tetanus completed in the last 10 years  HEALTH MAINTENANCE: Pap Smear HM Status: is up to date Mammogram HM Status: is not applicable for this patient Colon Cancer Screening HM Status: is not applicable for this patient Bone Density HM Status: is not applicable for this patient STI Testing HM  Status: was declined   She reports regular vision exams q1-5y: Yes  She reports regular dental exams q 45m:  Yes  The patient eats a regular, healthy diet. She endorses exercise and/or activity of:  routine  Most Recent Depression Screen:     09/15/2022    9:11 AM 08/22/2022   11:18 AM 02/11/2020    1:38 PM 08/23/2017    1:30 PM  Depression screen PHQ 2/9  Decreased Interest 0 0 0 0  Down, Depressed, Hopeless 0 0 0 0  PHQ - 2 Score 0 0 0 0   Most Recent Anxiety Screen:      No data to display         Most Recent Fall Screen:    09/15/2022    9:11 AM 08/22/2022   11:18 AM 02/11/2020    1:37 PM 08/23/2017    1:30 PM  Fall Risk   Falls in the past year? 0 0 0 No  Number falls in past yr: 0 0 0   Injury with Fall? 0 0 0   Risk for fall due to : No Fall Risks No Fall Risks    Follow up Falls evaluation completed Falls evaluation completed Falls evaluation completed     Past medical history, surgical history, medications, allergies, family history and social history reviewed with patient today and changes made to appropriate areas of the chart.  Past Medical History:  Past Medical History:  Diagnosis Date   No pertinent past medical history    Medications:  Current Outpatient Medications  on File Prior to Visit  Medication Sig   albuterol (VENTOLIN HFA) 108 (90 Base) MCG/ACT inhaler Inhale 1-2 puffs into the lungs every 6 (six) hours as needed for wheezing or shortness of breath.   levocetirizine (XYZAL) 5 MG tablet Take 1 tablet (5 mg total) by mouth every evening.   No current facility-administered medications on file prior to visit.   Surgical History:  Past Surgical History:  Procedure Laterality Date   NO PAST SURGERIES     Allergies:  No Known Allergies Family History:  Family History  Problem Relation Age of Onset   Hypertension Paternal Grandmother    Diabetes Paternal Aunt    Anesthesia problems Neg Hx        Objective:    BP 110/70   Pulse 64   Ht 5'  2.5" (1.588 m)   Wt 182 lb 6.4 oz (82.7 kg)   LMP 08/19/2022   BMI 32.83 kg/m   Wt Readings from Last 3 Encounters:  09/15/22 182 lb 6.4 oz (82.7 kg)  08/22/22 180 lb (81.6 kg)  02/11/20 175 lb 3.2 oz (79.5 kg)    Physical Exam Vitals and nursing note reviewed. Exam conducted with a chaperone present.  Constitutional:      General: She is not in acute distress.    Appearance: Normal appearance.  HENT:     Head: Normocephalic and atraumatic.     Right Ear: Hearing, tympanic membrane, ear canal and external ear normal.     Left Ear: Hearing, tympanic membrane, ear canal and external ear normal.     Nose: Nose normal.     Right Sinus: No maxillary sinus tenderness or frontal sinus tenderness.     Left Sinus: No maxillary sinus tenderness or frontal sinus tenderness.     Mouth/Throat:     Lips: Pink.     Mouth: Mucous membranes are moist.     Pharynx: Oropharynx is clear.  Eyes:     General: Lids are normal. Vision grossly intact.     Extraocular Movements: Extraocular movements intact.     Conjunctiva/sclera: Conjunctivae normal.     Pupils: Pupils are equal, round, and reactive to light.     Funduscopic exam:    Right eye: No hemorrhage. Red reflex present.        Left eye: No hemorrhage. Red reflex present.    Visual Fields: Right eye visual fields normal and left eye visual fields normal.  Neck:     Thyroid: No thyromegaly.     Vascular: No carotid bruit.  Cardiovascular:     Rate and Rhythm: Normal rate and regular rhythm.     Chest Wall: PMI is not displaced.     Pulses: Normal pulses.          Dorsalis pedis pulses are 2+ on the right side and 2+ on the left side.       Posterior tibial pulses are 2+ on the right side and 2+ on the left side.     Heart sounds: Normal heart sounds. No murmur heard. Pulmonary:     Effort: Pulmonary effort is normal. No respiratory distress.     Breath sounds: Normal breath sounds.  Chest:     Chest wall: No mass, deformity or  tenderness.  Breasts:    Breasts are symmetrical.     Right: Normal.     Left: Normal.  Abdominal:     General: Abdomen is flat. Bowel sounds are normal. There is no distension or abdominal  bruit.     Palpations: Abdomen is soft. There is no hepatomegaly, splenomegaly or mass.     Tenderness: There is no abdominal tenderness. There is no right CVA tenderness, left CVA tenderness, guarding or rebound.     Hernia: No hernia is present. There is no hernia in the left inguinal area or right inguinal area.  Genitourinary:    General: Normal vulva.     Exam position: Lithotomy position.     Pubic Area: No rash.      Tanner stage (genital): 5.     Labia:        Right: No rash or tenderness.        Left: No rash.      Urethra: No prolapse or urethral swelling.     Vagina: Normal. No vaginal discharge.     Cervix: Normal.     Uterus: Normal.      Adnexa: Right adnexa normal and left adnexa normal.     Rectum: Normal.  Musculoskeletal:        General: Normal range of motion.     Cervical back: Full passive range of motion without pain, normal range of motion and neck supple. No tenderness.     Right lower leg: No edema.     Left lower leg: No edema.  Feet:     Right foot:     Skin integrity: Skin integrity normal.     Toenail Condition: Right toenails are normal.     Left foot:     Skin integrity: Skin integrity normal.     Toenail Condition: Left toenails are normal.  Lymphadenopathy:     Cervical: No cervical adenopathy.     Upper Body:     Right upper body: No supraclavicular, axillary or pectoral adenopathy.     Left upper body: No supraclavicular, axillary or pectoral adenopathy.     Lower Body: No right inguinal adenopathy.  Skin:    General: Skin is warm and dry.     Capillary Refill: Capillary refill takes less than 2 seconds.     Nails: There is no clubbing.  Neurological:     General: No focal deficit present.     Mental Status: She is alert and oriented to person,  place, and time.     GCS: GCS eye subscore is 4. GCS verbal subscore is 5. GCS motor subscore is 6.     Cranial Nerves: No cranial nerve deficit.     Sensory: Sensation is intact. No sensory deficit.     Motor: Motor function is intact. No weakness.     Coordination: Coordination is intact. Coordination normal.     Gait: Gait is intact.     Deep Tendon Reflexes: Reflexes are normal and symmetric.  Psychiatric:        Attention and Perception: Attention normal.        Mood and Affect: Mood normal.        Speech: Speech normal.        Behavior: Behavior normal. Behavior is cooperative.        Thought Content: Thought content normal.        Cognition and Memory: Cognition and memory normal.        Judgment: Judgment normal.     Results for orders placed or performed in visit on 09/15/22  CBC with Differential/Platelet  Result Value Ref Range   WBC 7.2 3.4 - 10.8 x10E3/uL   RBC 5.00 3.77 - 5.28 x10E6/uL   Hemoglobin 11.0 (  L) 11.1 - 15.9 g/dL   Hematocrit 78.236.2 95.634.0 - 46.6 %   MCV 72 (L) 79 - 97 fL   MCH 22.0 (L) 26.6 - 33.0 pg   MCHC 30.4 (L) 31.5 - 35.7 g/dL   RDW 21.317.0 (H) 08.611.7 - 57.815.4 %   Platelets 204 150 - 450 x10E3/uL   Neutrophils 48 Not Estab. %   Lymphs 40 Not Estab. %   Monocytes 9 Not Estab. %   Eos 2 Not Estab. %   Basos 1 Not Estab. %   Neutrophils Absolute 3.5 1.4 - 7.0 x10E3/uL   Lymphocytes Absolute 2.9 0.7 - 3.1 x10E3/uL   Monocytes Absolute 0.7 0.1 - 0.9 x10E3/uL   EOS (ABSOLUTE) 0.1 0.0 - 0.4 x10E3/uL   Basophils Absolute 0.0 0.0 - 0.2 x10E3/uL   Immature Granulocytes 0 Not Estab. %   Immature Grans (Abs) 0.0 0.0 - 0.1 x10E3/uL  Comprehensive metabolic panel  Result Value Ref Range   Glucose 86 70 - 99 mg/dL   BUN 9 6 - 20 mg/dL   Creatinine, Ser 4.690.60 0.57 - 1.00 mg/dL   eGFR 629124 >52>59 WU/XLK/4.40mL/min/1.73   BUN/Creatinine Ratio 15 9 - 23   Sodium 138 134 - 144 mmol/L   Potassium 4.6 3.5 - 5.2 mmol/L   Chloride 103 96 - 106 mmol/L   CO2 20 20 - 29 mmol/L    Calcium 9.3 8.7 - 10.2 mg/dL   Total Protein 7.5 6.0 - 8.5 g/dL   Albumin 4.3 4.0 - 5.0 g/dL   Globulin, Total 3.2 1.5 - 4.5 g/dL   Albumin/Globulin Ratio 1.3 1.2 - 2.2   Bilirubin Total 0.2 0.0 - 1.2 mg/dL   Alkaline Phosphatase 100 44 - 121 IU/L   AST 16 0 - 40 IU/L   ALT 16 0 - 32 IU/L  Hemoglobin A1c  Result Value Ref Range   Hgb A1c MFr Bld 5.8 (H) 4.8 - 5.6 %   Est. average glucose Bld gHb Est-mCnc 120 mg/dL  Lipid panel  Result Value Ref Range   Cholesterol, Total 160 100 - 199 mg/dL   Triglycerides 86 0 - 149 mg/dL   HDL 51 >10>39 mg/dL   VLDL Cholesterol Cal 16 5 - 40 mg/dL   LDL Chol Calc (NIH) 93 0 - 99 mg/dL   Chol/HDL Ratio 3.1 0.0 - 4.4 ratio  VITAMIN D 25 Hydroxy (Vit-D Deficiency, Fractures)  Result Value Ref Range   Vit D, 25-Hydroxy 16.8 (L) 30.0 - 100.0 ng/mL  Cytology - PAP  Result Value Ref Range   High risk HPV Negative    Neisseria Gonorrhea Negative    Chlamydia Negative    Trichomonas Negative    Adequacy      Satisfactory for evaluation; transformation zone component PRESENT.   Diagnosis      - Negative for intraepithelial lesion or malignancy (NILM)   Comment Normal Reference Range Trichomonas - Negative    Comment Normal Reference Range HPV - Negative    Comment Normal Reference Ranger Chlamydia - Negative    Comment      Normal Reference Range Neisseria Gonorrhea - Negative         Assessment & Plan:   Problem List Items Addressed This Visit     Environmental and seasonal allergies    Symptoms consistent with seasonal allergies due to pollen. I recommend xyzal at bedtime and consider use of a different medication in the morning if additional support is needed due to the severity of symptoms.  Relevant Orders   CBC with Differential/Platelet (Completed)   Comprehensive metabolic panel (Completed)   Hemoglobin A1c (Completed)   Lipid panel (Completed)   VITAMIN D 25 Hydroxy (Vit-D Deficiency, Fractures) (Completed)   Cytology -  PAP (Completed)   Varicose veins of both lower extremities with pain    Exam shows the presence of varicosities present bilaterally on the lower extremities with no skin breakdown present. Given her family history and the current state, I do recommend seeking the opinion of vascular surgery for recommendations that may help reduce appearance, pain, and edema in the legs.       Relevant Orders   CBC with Differential/Platelet (Completed)   Comprehensive metabolic panel (Completed)   Hemoglobin A1c (Completed)   Lipid panel (Completed)   VITAMIN D 25 Hydroxy (Vit-D Deficiency, Fractures) (Completed)   Cytology - PAP (Completed)   Encounter for annual physical exam - Primary    CPE today with pap.  Labs pending. Will make changes as necessary based on results.  Review of HM activities and recommendations discussed and provided on AVS Anticipatory guidance, diet, and exercise recommendations provided.  Medications, allergies, and hx reviewed and updated as necessary.  Plan to f/u with CPE in 1 year or sooner for acute/chronic health needs as directed.        Relevant Orders   CBC with Differential/Platelet (Completed)   Comprehensive metabolic panel (Completed)   Hemoglobin A1c (Completed)   Lipid panel (Completed)   VITAMIN D 25 Hydroxy (Vit-D Deficiency, Fractures) (Completed)   Cytology - PAP (Completed)   Other Visit Diagnoses     Health care maintenance       Relevant Orders   CBC with Differential/Platelet (Completed)   Comprehensive metabolic panel (Completed)   Hemoglobin A1c (Completed)   Lipid panel (Completed)   VITAMIN D 25 Hydroxy (Vit-D Deficiency, Fractures) (Completed)   Cytology - PAP (Completed)   Papanicolaou smear for cervical cancer screening       Relevant Orders   CBC with Differential/Platelet (Completed)   Comprehensive metabolic panel (Completed)   Hemoglobin A1c (Completed)   Lipid panel (Completed)   VITAMIN D 25 Hydroxy (Vit-D Deficiency,  Fractures) (Completed)   Cytology - PAP (Completed)   Prediabetes       Relevant Orders   Comprehensive metabolic panel   Hemoglobin A1c   Vitamin D deficiency       Relevant Medications   Cholecalciferol (VITAMIN D3) 1.25 MG (50000 UT) TABS   Other Relevant Orders   VITAMIN D 25 Hydroxy (Vit-D Deficiency, Fractures)   Iron deficiency anemia, unspecified iron deficiency anemia type       Relevant Orders   Iron, TIBC and Ferritin Panel   CBC with Differential/Platelet          Follow up plan: Return in about 1 year (around 09/15/2023) for CPE.  NEXT PREVENTATIVE PHYSICAL DUE IN 1 YEAR.  PATIENT COUNSELING PROVIDED FOR ALL ADULT PATIENTS:  Consume a well balanced diet low in saturated fats, cholesterol, and moderation in carbohydrates.   This can be as simple as monitoring portion sizes and cutting back on sugary beverages such as soda and juice to start with.    Daily water consumption of at least 64 ounces.  Physical activity at least 180 minutes per week, if just starting out.   This can be as simple as taking the stairs instead of the elevator and walking 2-3 laps around the office  purposefully every day.   STD protection, partner selection,  and regular testing if high risk.  Limited consumption of alcoholic beverages if alcohol is consumed.  For women, I recommend no more than 7 alcoholic beverages per week, spread out throughout the week.  Avoid "binge" drinking or consuming large quantities of alcohol in one setting.   Please let me know if you feel you may need help with reduction or quitting alcohol consumption.   Avoidance of nicotine, if used.  Please let me know if you feel you may need help with reduction or quitting nicotine use.   Daily mental health attention.  This can be in the form of 5 minute daily meditation, prayer, journaling, yoga, reflection, etc.   Purposeful attention to your emotions and mental state can significantly improve your overall  wellbeing and Health.  Please know that I am here to help you with all of your health care goals and am happy to work with you to find a solution that works best for you.  The greatest advice I have received with any changes in life are to take it one step at a time, that even means if all you can focus on is the next 60 seconds, then do that and celebrate your victories.  With any changes in life, you will have set backs, and that is OK. The important thing to remember is, if you have a set back, it is not a failure, it is an opportunity to try again!  Health Maintenance Recommendations Screening Testing Mammogram Every 1 -2 years based on history and risk factors Starting at age 89 Pap Smear Ages 21-39 every 3 years Ages 19-65 every 5 years with HPV testing More frequent testing may be required based on results and history Colon Cancer Screening Every 1-10 years based on test performed, risk factors, and history Starting at age 51 Bone Density Screening Every 2-10 years based on history Starting at age 39 for women Recommendations for men differ based on medication usage, history, and risk factors AAA Screening One time ultrasound Men 9-42 years old who have every smoked Lung Cancer Screening Low Dose Lung CT every 12 months Age 63-80 years with a 30 pack-year smoking history who still smoke or who have quit within the last 15 years  Screening Labs Routine  Labs: Complete Blood Count (CBC), Complete Metabolic Panel (CMP), Cholesterol (Lipid Panel) Every 6-12 months based on history and medications May be recommended more frequently based on current conditions or previous results Hemoglobin A1c Lab Every 3-12 months based on history and previous results Starting at age 73 or earlier with diagnosis of diabetes, high cholesterol, BMI >26, and/or risk factors Frequent monitoring for patients with diabetes to ensure blood sugar control Thyroid Panel (TSH w/ T3 & T4) Every 6 months  based on history, symptoms, and risk factors May be repeated more often if on medication HIV One time testing for all patients 55 and older May be repeated more frequently for patients with increased risk factors or exposure Hepatitis C One time testing for all patients 61 and older May be repeated more frequently for patients with increased risk factors or exposure Gonorrhea, Chlamydia Every 12 months for all sexually active persons 13-24 years Additional monitoring may be recommended for those who are considered high risk or who have symptoms PSA Men 56-14 years old with risk factors Additional screening may be recommended from age 50-69 based on risk factors, symptoms, and history  Vaccine Recommendations Tetanus Booster All adults every 10 years Flu Vaccine All patients 6 months  and older every year COVID Vaccine All patients 12 years and older Initial dosing with booster May recommend additional booster based on age and health history HPV Vaccine 2 doses all patients age 48-26 Dosing may be considered for patients over 26 Shingles Vaccine (Shingrix) 2 doses all adults 55 years and older Pneumonia (Pneumovax 23) All adults 65 years and older May recommend earlier dosing based on health history Pneumonia (Prevnar 13) All adults 65 years and older Dosed 1 year after Pneumovax 23  Additional Screening, Testing, and Vaccinations may be recommended on an individualized basis based on family history, health history, risk factors, and/or exposure.

## 2022-09-15 NOTE — Patient Instructions (Signed)
I have sent [facility] Varicose Veins in your legs. They will call you to set up a time to schedule.

## 2022-09-16 LAB — HEMOGLOBIN A1C
Est. average glucose Bld gHb Est-mCnc: 120 mg/dL
Hgb A1c MFr Bld: 5.8 % — ABNORMAL HIGH (ref 4.8–5.6)

## 2022-09-16 LAB — COMPREHENSIVE METABOLIC PANEL
ALT: 16 IU/L (ref 0–32)
AST: 16 IU/L (ref 0–40)
Albumin/Globulin Ratio: 1.3 (ref 1.2–2.2)
Albumin: 4.3 g/dL (ref 4.0–5.0)
Alkaline Phosphatase: 100 IU/L (ref 44–121)
BUN/Creatinine Ratio: 15 (ref 9–23)
BUN: 9 mg/dL (ref 6–20)
Bilirubin Total: 0.2 mg/dL (ref 0.0–1.2)
CO2: 20 mmol/L (ref 20–29)
Calcium: 9.3 mg/dL (ref 8.7–10.2)
Chloride: 103 mmol/L (ref 96–106)
Creatinine, Ser: 0.6 mg/dL (ref 0.57–1.00)
Globulin, Total: 3.2 g/dL (ref 1.5–4.5)
Glucose: 86 mg/dL (ref 70–99)
Potassium: 4.6 mmol/L (ref 3.5–5.2)
Sodium: 138 mmol/L (ref 134–144)
Total Protein: 7.5 g/dL (ref 6.0–8.5)
eGFR: 124 mL/min/{1.73_m2} (ref >59)

## 2022-09-16 LAB — CBC WITH DIFFERENTIAL/PLATELET
Basophils Absolute: 0 10*3/uL (ref 0.0–0.2)
Basos: 1 %
EOS (ABSOLUTE): 0.1 10*3/uL (ref 0.0–0.4)
Eos: 2 %
Hematocrit: 36.2 % (ref 34.0–46.6)
Hemoglobin: 11 g/dL — ABNORMAL LOW (ref 11.1–15.9)
Immature Grans (Abs): 0 10*3/uL (ref 0.0–0.1)
Immature Granulocytes: 0 %
Lymphocytes Absolute: 2.9 10*3/uL (ref 0.7–3.1)
Lymphs: 40 %
MCH: 22 pg — ABNORMAL LOW (ref 26.6–33.0)
MCHC: 30.4 g/dL — ABNORMAL LOW (ref 31.5–35.7)
MCV: 72 fL — ABNORMAL LOW (ref 79–97)
Monocytes Absolute: 0.7 10*3/uL (ref 0.1–0.9)
Monocytes: 9 %
Neutrophils Absolute: 3.5 10*3/uL (ref 1.4–7.0)
Neutrophils: 48 %
Platelets: 204 10*3/uL (ref 150–450)
RBC: 5 x10E6/uL (ref 3.77–5.28)
RDW: 17 % — ABNORMAL HIGH (ref 11.7–15.4)
WBC: 7.2 10*3/uL (ref 3.4–10.8)

## 2022-09-16 LAB — LIPID PANEL
Chol/HDL Ratio: 3.1 ratio (ref 0.0–4.4)
Cholesterol, Total: 160 mg/dL (ref 100–199)
HDL: 51 mg/dL (ref >39)
LDL Chol Calc (NIH): 93 mg/dL (ref 0–99)
Triglycerides: 86 mg/dL (ref 0–149)
VLDL Cholesterol Cal: 16 mg/dL (ref 5–40)

## 2022-09-16 LAB — VITAMIN D 25 HYDROXY (VIT D DEFICIENCY, FRACTURES): Vit D, 25-Hydroxy: 16.8 ng/mL — ABNORMAL LOW (ref 30.0–100.0)

## 2022-09-19 DIAGNOSIS — Z0000 Encounter for general adult medical examination without abnormal findings: Secondary | ICD-10-CM | POA: Insufficient documentation

## 2022-09-19 MED ORDER — VITAMIN D3 1.25 MG (50000 UT) PO TABS: 1.0000 | ORAL_TABLET | ORAL | 1 refills | Status: DC

## 2022-09-20 ENCOUNTER — Other Ambulatory Visit: Payer: Self-pay

## 2022-09-20 LAB — CYTOLOGY - PAP
Chlamydia: NEGATIVE
Comment: NEGATIVE
Comment: NEGATIVE
Comment: NEGATIVE
Comment: NORMAL
Diagnosis: NEGATIVE
High risk HPV: NEGATIVE
Neisseria Gonorrhea: NEGATIVE
Trichomonas: NEGATIVE

## 2022-09-20 MED ORDER — IRON (FERROUS SULFATE) 325 (65 FE) MG PO TABS: 325.0000 mg | ORAL_TABLET | ORAL | 1 refills | Status: DC

## 2022-09-22 NOTE — Assessment & Plan Note (Signed)
CPE today with pap.  Labs pending. Will make changes as necessary based on results.  Review of HM activities and recommendations discussed and provided on AVS Anticipatory guidance, diet, and exercise recommendations provided.  Medications, allergies, and hx reviewed and updated as necessary.  Plan to f/u with CPE in 1 year or sooner for acute/chronic health needs as directed.

## 2022-09-22 NOTE — Assessment & Plan Note (Signed)
Exam shows the presence of varicosities present bilaterally on the lower extremities with no skin breakdown present. Given her family history and the current state, I do recommend seeking the opinion of vascular surgery for recommendations that may help reduce appearance, pain, and edema in the legs.

## 2022-09-22 NOTE — Assessment & Plan Note (Signed)
Symptoms consistent with seasonal allergies due to pollen. I recommend xyzal at bedtime and consider use of a different medication in the morning if additional support is needed due to the severity of symptoms.

## 2022-09-29 ENCOUNTER — Encounter: Payer: Self-pay | Admitting: Nurse Practitioner

## 2022-12-05 ENCOUNTER — Encounter: Payer: Self-pay | Admitting: Nurse Practitioner

## 2022-12-05 DIAGNOSIS — I83813 Varicose veins of bilateral lower extremities with pain: Secondary | ICD-10-CM

## 2022-12-13 ENCOUNTER — Ambulatory Visit: Payer: Medicaid Other | Admitting: Vascular Surgery

## 2022-12-13 ENCOUNTER — Encounter: Payer: Self-pay | Admitting: Vascular Surgery

## 2022-12-13 VITALS — BP 127/76 | HR 75 | Temp 98.4°F | Resp 14 | Ht 62.0 in | Wt 175.0 lb

## 2022-12-13 DIAGNOSIS — I83813 Varicose veins of bilateral lower extremities with pain: Secondary | ICD-10-CM | POA: Diagnosis not present

## 2022-12-13 NOTE — Progress Notes (Signed)
ASSESSMENT & PLAN   CHRONIC VENOUS DISEASE: This patient has CEAP C1 venous disease (telangiectasias).  She does have significant symptoms of venous hypertension especially in the left leg.  I did note some reflux in her left great saphenous vein which does appear dilated.  I have encouraged her to avoid prolonged sitting and standing.  We discussed the importance of exercise specifically walking and water aerobics.  I have also recommended knee-high compression stockings with a gradient of 15 to 20 mmHg if she will be standing or sitting a lot or during travel.  I have also recommended daily leg elevation and we have discussed the proper positioning for this.  We also discussed the importance of maintaining healthy weight.  She does have some reticular veins and telangiectasias bilaterally and would be a candidate for sclerotherapy.  I did have Colleen Julian, RN our sclerotherapy nurse discussed this with her.  She will call if she would like to consider sclerotherapy.  Given that she is having significant symptoms of venous hypertension as described below she would like to get formal venous reflux testing on the left leg where her symptoms are more bothersome.  We will schedule that in the near future and I will see her back after that study.  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  The consult is requested by Colleen Skeens, NP.   HPI:   Colleen Villegas is a 32 y.o. female who was referred with painful varicose veins of both lower extremities.  I have reviewed the records from the referring office.  Patient was seen on 09/15/2022.  She was concerned about some varicose veins in both legs and wanted to consider her options.  She was therefore set up for vascular consultation.  On my history, she describes aching pain and heavy nests in both legs but especially on the left.  Her symptoms are aggravated by sitting and standing and relieved with elevation.  She has not  been wearing compression stockings as they are not comfortable.  Her symptoms are worse at the end of the day.  She denies any previous history of DVT.  She has had no previous venous procedures.  She has 2 children but does not plan on having more children.  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  Past Medical History:  Diagnosis Date   No pertinent past medical history     Family History  Problem Relation Age of Onset   Hypertension Paternal Grandmother    Diabetes Paternal Aunt    Anesthesia problems Neg Hx     SOCIAL HISTORY: Social History   Tobacco Use   Smoking status: Never   Smokeless tobacco: Never  Substance Use Topics   Alcohol use: No    No Known Allergies  Current Outpatient Medications  Medication Sig Dispense Refill   albuterol (VENTOLIN HFA) 108 (90 Base) MCG/ACT inhaler Inhale 1-2 puffs into the lungs every 6 (six) hours as needed for wheezing or shortness of breath. 18 g 6   Cholecalciferol (VITAMIN D3) 1.25 MG (50000 UT) TABS Take 1 tablet by mouth once a week. 12 tablet 1   Iron, Ferrous Sulfate, 325 (65 Fe) MG TABS Take 325 mg by mouth 3 (three) times a week. 30 tablet 1   levocetirizine (XYZAL) 5 MG tablet Take 1 tablet (5 mg total) by mouth every evening. 90 tablet 1   No current facility-administered medications for  this visit.    REVIEW OF SYSTEMS:  [X]  denotes positive finding, [ ]  denotes negative finding Cardiac  Comments:  Chest pain or chest pressure:    Shortness of breath upon exertion:    Short of breath when lying flat:    Irregular heart rhythm:        Vascular    Pain in calf, thigh, or hip brought on by ambulation:    Pain in feet at night that wakes you up from your sleep:     Blood clot in your veins:    Leg swelling:  X       Pulmonary    Oxygen at home:    Productive cough:     Wheezing:         Neurologic    Sudden weakness in arms or legs:     Sudden numbness in arms or legs:      Sudden onset of difficulty speaking or slurred speech:    Temporary loss of vision in one eye:     Problems with dizziness:         Gastrointestinal    Blood in stool:     Vomited blood:         Genitourinary    Burning when urinating:     Blood in urine:        Psychiatric    Major depression:         Hematologic    Bleeding problems:    Problems with blood clotting too easily:        Skin    Rashes or ulcers:        Constitutional    Fever or chills:    -  PHYSICAL EXAM:   Vitals:   12/13/22 1059  BP: 127/76  Pulse: 75  Resp: 14  Temp: 98.4 F (36.9 C)  TempSrc: Temporal  SpO2: (!) 75%  Weight: 175 lb (79.4 kg)  Height: 5\' 2"  (1.575 m)   Body mass index is 32.01 kg/m. GENERAL: The patient is a well-nourished female, in no acute distress. The vital signs are documented above. CARDIAC: There is a regular rate and rhythm.  VASCULAR: I do not detect carotid bruits. She has palpable pedal pulses. She has some reticular veins and telangiectasias bilaterally.         I did look at her great saphenous vein myself with the [employer].  The right great saphenous vein was not especially dilated.  The left great saphenous vein was dilated and did have reflux in the thigh.  PULMONARY: There is good air exchange bilaterally without wheezing or rales. ABDOMEN: Soft and non-tender with normal pitched bowel sounds.  MUSCULOSKELETAL: There are no major deformities. NEUROLOGIC: No focal weakness or paresthesias are detected. SKIN: There are no ulcers or rashes noted. PSYCHIATRIC: The patient has a normal affect.  DATA:    She did not have formal venous reflux testing.  Colleen Villegas [facility]

## 2022-12-15 ENCOUNTER — Other Ambulatory Visit: Payer: Self-pay | Admitting: *Deleted

## 2022-12-15 DIAGNOSIS — I83813 Varicose veins of bilateral lower extremities with pain: Secondary | ICD-10-CM

## 2022-12-20 ENCOUNTER — Ambulatory Visit (HOSPITAL_COMMUNITY)
Admission: RE | Admit: 2022-12-20 | Discharge: 2022-12-20 | Disposition: A | Discharge status: Home / Self Care | Payer: Medicaid Other | Source: Ambulatory Visit | Attending: Vascular Surgery | Admitting: Vascular Surgery

## 2022-12-20 DIAGNOSIS — I83813 Varicose veins of bilateral lower extremities with pain: Secondary | ICD-10-CM | POA: Diagnosis not present

## 2023-01-03 ENCOUNTER — Ambulatory Visit: Payer: Medicaid Other | Admitting: Vascular Surgery

## 2023-01-03 ENCOUNTER — Encounter: Payer: Self-pay | Admitting: Vascular Surgery

## 2023-01-03 VITALS — BP 109/73 | HR 65 | Temp 98.2°F | Resp 18 | Ht 62.0 in | Wt 176.0 lb

## 2023-01-03 DIAGNOSIS — I872 Venous insufficiency (chronic) (peripheral): Secondary | ICD-10-CM | POA: Diagnosis not present

## 2023-01-03 DIAGNOSIS — I8393 Asymptomatic varicose veins of bilateral lower extremities: Secondary | ICD-10-CM

## 2023-01-03 DIAGNOSIS — I83813 Varicose veins of bilateral lower extremities with pain: Secondary | ICD-10-CM | POA: Diagnosis not present

## 2023-01-03 NOTE — Progress Notes (Unsigned)
REASON FOR VISIT:   Follow-up of venous insufficiency  MEDICAL ISSUES:   CHRONIC VENOUS INSUFFICIENCY: This patient continues to have symptoms of venous hypertension.  She describes aching pain and heaviness in the left leg which is aggravated by sitting and standing and relieved with elevation.  She has been wearing the knee-high stockings which helps some but is still having symptoms.  For this reason we fitted her for thigh-high compression stockings with a gradient of 20 to 30 mmHg.  I have again encouraged her to avoid prolonged sitting and standing.  We have discussed the importance of exercise and daily leg elevation.  Will have her return in 3 months to be evaluated by Dr. Randie Heinz or Dr. Myra Gianotti.  I have explained that I will be retiring.  If she still having symptoms I think she would be a good candidate for laser ablation of the left great saphenous vein.   HPI:   Colleen Villegas is a pleasant 32 y.o. female who I last saw on 12/13/2022.  She had CEAP C1 venous disease (telangiectasias).  She was having symptoms of venous hypertension in the left leg.  I did note some reflux in her left great saphenous vein which appear dilated.  We discussed conservative measures.  I encouraged her to avoid prolonged sitting and standing.  We discussed importance of exercise.  I encouraged her to wear compression stockings.  We also discussed the importance of daily leg elevation.  I feel that she would be a candidate for sclerotherapy.  However given that she was having symptoms of venous hypertension I thought it would be best to get her back for formal venous reflux testing on the left leg.  She comes in today for that visit.  Since I saw her last she has continued to have aching pain and heaviness in the left leg which is aggravated by standing and sitting and relieved with elevation.  She has tried the knee-high compression stockings which helps some.  Past Medical History:  Diagnosis Date   No  pertinent past medical history     Family History  Problem Relation Age of Onset   Hypertension Paternal Grandmother    Diabetes Paternal Aunt    Anesthesia problems Neg Hx     SOCIAL HISTORY: Social History   Tobacco Use   Smoking status: Never   Smokeless tobacco: Never  Substance Use Topics   Alcohol use: No    No Known Allergies  Current Outpatient Medications  Medication Sig Dispense Refill   albuterol (VENTOLIN HFA) 108 (90 Base) MCG/ACT inhaler Inhale 1-2 puffs into the lungs every 6 (six) hours as needed for wheezing or shortness of breath. 18 g 6   Cholecalciferol (VITAMIN D3) 1.25 MG (50000 UT) TABS Take 1 tablet by mouth once a week. 12 tablet 1   Iron, Ferrous Sulfate, 325 (65 Fe) MG TABS Take 325 mg by mouth 3 (three) times a week. 30 tablet 1   levocetirizine (XYZAL) 5 MG tablet Take 1 tablet (5 mg total) by mouth every evening. 90 tablet 1   No current facility-administered medications for this visit.    REVIEW OF SYSTEMS:  [X]  denotes positive finding, [ ]  denotes negative finding Cardiac  Comments:  Chest pain or chest pressure:    Shortness of breath upon exertion:    Short of breath when lying flat:    Irregular heart rhythm:        Vascular    Pain in calf, thigh,  or hip brought on by ambulation:    Pain in feet at night that wakes you up from your sleep:     Blood clot in your veins:    Leg swelling:         Pulmonary    Oxygen at home:    Productive cough:     Wheezing:         Neurologic    Sudden weakness in arms or legs:     Sudden numbness in arms or legs:     Sudden onset of difficulty speaking or slurred speech:    Temporary loss of vision in one eye:     Problems with dizziness:         Gastrointestinal    Blood in stool:     Vomited blood:         Genitourinary    Burning when urinating:     Blood in urine:        Psychiatric    Major depression:         Hematologic    Bleeding problems:    Problems with blood  clotting too easily:        Skin    Rashes or ulcers:        Constitutional    Fever or chills:     PHYSICAL EXAM:   Vitals:   01/03/23 1442  BP: 109/73  Pulse: 65  Resp: 18  Temp: 98.2 F (36.8 C)  TempSrc: Temporal  SpO2: 99%  Weight: 176 lb (79.8 kg)  Height: 5\' 2"  (1.575 m)    GENERAL: The patient is a well-nourished female, in no acute distress. The vital signs are documented above. CARDIAC: There is a regular rate and rhythm.  VASCULAR: She has palpable pedal pulses. PULMONARY: There is good air exchange bilaterally without wheezing or rales. ABDOMEN: Soft and non-tender with normal pitched bowel sounds.  MUSCULOSKELETAL: There are no major deformities or cyanosis. NEUROLOGIC: No focal weakness or paresthesias are detected. SKIN: There are no ulcers or rashes noted. PSYCHIATRIC: The patient has a normal affect.  DATA:    VENOUS DUPLEX: Reviewed the venous duplex scan that was done on 12/20/2022.  This was on the left lower extremity only.  There was no evidence of DVT.  There was deep venous reflux in the common femoral vein.  There was superficial venous reflux in the left great saphenous vein from the saphenofemoral junction down to the proximal calf.  Diameters of the vein ranged from 4-6 mm.  Results of the study are summarized in the diagram below.    Waverly Ferrari [facility] [phone]

## 2023-01-10 ENCOUNTER — Ambulatory Visit: Payer: Medicaid Other | Admitting: Nurse Practitioner

## 2023-02-09 ENCOUNTER — Ambulatory Visit: Payer: Medicaid Other | Admitting: Nurse Practitioner

## 2023-02-09 ENCOUNTER — Encounter: Payer: Self-pay | Admitting: Nurse Practitioner

## 2023-02-09 VITALS — BP 122/82 | HR 71 | Wt 176.8 lb

## 2023-02-09 DIAGNOSIS — N921 Excessive and frequent menstruation with irregular cycle: Secondary | ICD-10-CM

## 2023-02-09 DIAGNOSIS — N946 Dysmenorrhea, unspecified: Secondary | ICD-10-CM | POA: Diagnosis not present

## 2023-02-09 DIAGNOSIS — N920 Excessive and frequent menstruation with regular cycle: Secondary | ICD-10-CM | POA: Insufficient documentation

## 2023-02-09 MED ORDER — IBUPROFEN 800 MG PO TABS: ORAL_TABLET | ORAL | 3 refills | Status: AC

## 2023-02-09 NOTE — Progress Notes (Signed)
  Colleen Eth, DNP, AGNP-c [facility] [address] [place], [place] [address] [phone]   ACUTE VISIT- ESTABLISHED PATIENT  Blood pressure 122/82, pulse 71, weight 176 lb 12.8 oz (80.2 kg).  Subjective:  HPI Colleen Villegas is a 32 y.o. female presents to day for evaluation of acute concern(s).   Menorrhagia Colleen Villegas tells me that she has been having heavy bleeding with her periods, passing large clots, and intense cramping. She reports that the symptoms have been present over the last year. She started her period at age 62 and she had cramps, but after she had children the clotting started. Her periods usually last about 6 days, start off heavy on the first 2 days then lighter on day 3, then heavy again with clots on day 4-6.   ROS negative except for what is listed in HPI. History, Medications, Surgery, SDOH, and Family History reviewed and updated as appropriate.  Objective:  Physical Exam Constitutional:      Appearance: Normal appearance.  HENT:     Head: Normocephalic.  Eyes:     Conjunctiva/sclera: Conjunctivae normal.  Cardiovascular:     Rate and Rhythm: Normal rate and regular rhythm.     Pulses: Normal pulses.     Heart sounds: Normal heart sounds.  Pulmonary:     Effort: Pulmonary effort is normal.  Abdominal:     General: Abdomen is flat. Bowel sounds are normal. There is no distension.     Palpations: Abdomen is soft. There is no mass.     Tenderness: There is no abdominal tenderness. There is no guarding or rebound.  Musculoskeletal:     Right lower leg: No edema.     Left lower leg: No edema.  Skin:    General: Skin is warm and dry.     Capillary Refill: Capillary refill takes less than 2 seconds.  Neurological:     General: No focal deficit present.     Mental Status: She is alert and oriented to person, place, and time.  Psychiatric:        Mood and Affect: Mood normal.         Assessment & Plan:   Problem List Items  Addressed This Visit     Menorrhagia with irregular cycle - Primary    Ongoing concerns with menorrhagia and dysmenorrhea with passing of large clots. She also reports intermittent missed periods. Her exam is normal, recent pap normal. We discussed these findings could be hormonal or due to a uterine issue such as fibroids/polyps, PCOS, etc. We will plan to get an ultrasound for evaluation of the uterus and then proceed with additional labs as needed when she returns in a month for her iron. We can always push this visit up, if needed. I have sent ibuprofen 800mg  to be taken during menses to help reduce cramping and flow. We will monitor how this works for her.       Relevant Medications   ibuprofen (ADVIL) 800 MG tablet   Other Relevant Orders   US PELVIC COMPLETE WITH TRANSVAGINAL   Other Visit Diagnoses     Dysmenorrhea       Relevant Medications   ibuprofen (ADVIL) 800 MG tablet   Other Relevant Orders   US PELVIC COMPLETE WITH TRANSVAGINAL        Colleen Eth, DNP, AGNP-c

## 2023-02-09 NOTE — Assessment & Plan Note (Signed)
Ongoing concerns with menorrhagia and dysmenorrhea with passing of large clots. She also reports intermittent missed periods. Her exam is normal, recent pap normal. We discussed these findings could be hormonal or due to a uterine issue such as fibroids/polyps, PCOS, etc. We will plan to get an ultrasound for evaluation of the uterus and then proceed with additional labs as needed when she returns in a month for her iron. We can always push this visit up, if needed. I have sent ibuprofen 800mg  to be taken during menses to help reduce cramping and flow. We will monitor how this works for her.

## 2023-02-09 NOTE — Patient Instructions (Signed)
Menorrhagia Menorrhagia is a form of abnormal uterine bleeding in which menstrual periods are heavy or last longer than normal. With menorrhagia, the periods may cause enough blood loss and cramping that a woman becomes unable to take part in her usual activities. What are the causes? Common causes of this condition include: Polyps or fibroids. These are noncancerous growths in the uterus. An imbalance of the hormones estrogen and progesterone. Anovulation, which occurs when one of the ovaries does not release an egg during one or more months. A problem with the thyroid gland (hypothyroidism). Side effects of having an intrauterine device (IUD). Side effects of some medicines, such as NSAIDs or blood thinners. A bleeding disorder that stops the blood from clotting normally. In some cases, the cause of this condition is not known. What increases the risk? You are more likely to develop this condition if you have cancer of the uterus. What are the signs or symptoms? Symptoms of this condition include: Routinely having to change your pad or tampon every 1-2 hours because it is soaked. Needing to use pads and tampons at the same time because of heavy bleeding. Needing to wake up to change your pads or tampons during the night. Passing blood clots larger than 1 inch (2.5 cm) in size. Having bleeding that lasts for more than 7 days. Having symptoms of low iron levels (anemia), such as tiredness (fatigue) or shortness of breath. How is this diagnosed? This condition may be diagnosed based on: A physical exam. Your symptoms and menstrual history. Tests, such as: Blood tests to check if you are pregnant or if you have hormonal changes, a bleeding or thyroid disorder, anemia, or other problems. Pap test to check for cancerous changes, infections, or inflammation. Endometrial biopsy. This test involves removing a tissue sample from the lining of the uterus (endometrium) to be examined under a  microscope. Pelvic ultrasound. This test uses sound waves to create images of your uterus, ovaries, and vagina. The images can show if you have fibroids or other growths. Hysteroscopy. For this test, a thin, flexible tube with a light on the end (hysteroscope) is used to look inside your uterus. How is this treated? Treatment may not be needed for this condition. If it is needed, the best treatment for you will depend on: Whether you need to prevent pregnancy. Your desire to have children in the future. The cause and severity of your bleeding. Your personal preference. Medicine Medicines are the first step in treatment. You may be treated with: Hormonal birth control methods. These treatments reduce bleeding during your menstrual period. They include: Birth control pills. Skin patch. Vaginal ring. Shots (injections) that you get every 3 months. Hormonal IUD. Implants that go under the skin. Medicines that thicken the blood and slow bleeding. Medicines that reduce swelling, such as ibuprofen. Medicines that contain an artificial (synthetic) hormone called progestin. Medicines that make the ovaries stop working for a short time. Iron supplements to treat anemia.  Surgery If medicines do not work, surgery may be done. Surgical options may include: Dilation and curettage (D&C). In this procedure, your health care provider opens the lowest part of the uterus (cervix) and then scrapes or suctions tissue from the endometrium. This reduces menstrual bleeding. Operative hysteroscopy. In this procedure, a hysteroscope is used to view your uterus and help remove polyps that may be causing heavy periods. Endometrial ablation. This is when various techniques are used to permanently destroy your entire endometrium. After endometrial ablation, most women have little   or no menstrual flow. This procedure reduces your ability to become pregnant. Endometrial resection. In this procedure, an electrosurgical  wire loop is used to remove the endometrium. This procedure reduces your ability to become pregnant. Hysterectomy. This is surgical removal of your uterus. This is a permanent procedure that stops menstrual periods. Pregnancy is not possible after a hysterectomy. Follow these instructions at home: Medicines Take over-the-counter and prescription medicines only as told by your health care provider. This includes iron pills. Do not change or switch medicines without asking your health care provider. Do not take aspirin or medicines that contain aspirin 1 week before or during your menstrual period. Aspirin may make bleeding worse. Managing constipation Your iron pills may cause constipation. If you are taking prescription iron supplements, you may need to take these actions to prevent or treat constipation: Drink enough fluid to keep your urine pale yellow. Take over-the-counter or prescription medicines. Eat foods that are high in fiber, such as beans, whole grains, and fresh fruits and vegetables. Limit foods that are high in fat and processed sugars, such as fried or sweet foods. General instructions If you need to change your sanitary pad or tampon more than once every 2 hours, limit your activity until the bleeding stops. Eat well-balanced meals, including foods that are high in iron. Foods that have a lot of iron include leafy green vegetables, meat, liver, eggs, and whole-grain breads and cereals. Do not try to lose weight until the abnormal bleeding has stopped and your blood iron level is back to normal. If you need to lose weight, work with your health care provider to lose weight safely. Keep all follow-up visits. This is important. Contact a health care provider if: You soak through a pad or tampon every 1 or 2 hours, and this happens every time you have a period. You need to use pads and tampons at the same time because you are bleeding so much. You have nausea, vomiting, diarrhea, or  other problems related to medicines you are taking. Get help right away if: You soak through more than a pad or tampon in 1 hour. You pass clots bigger than 1 inch (2.5 cm) wide. You feel short of breath. You feel like your heart is beating too fast. You feel dizzy or you faint. You feel very weak or tired. Summary Menorrhagia is a form of abnormal uterine bleeding in which menstrual periods are heavy or last longer than normal. Treatment may not be needed for this condition. If it is needed, it may include medicines or procedures. Take over-the-counter and prescription medicines only as told by your health care provider. This includes iron pills. Get help right away if you have heavy bleeding that soaks through more than a pad or tampon in 1 hour, you pass large clots, or you feel dizzy, short of breath, or very weak or tired. This information is not intended to replace advice given to you by your health care provider. Make sure you discuss any questions you have with your health care provider. Document Revised: 02/10/2020 Document Reviewed: 02/10/2020 Elsevier Patient Education  2024 [employer].

## 2023-02-20 ENCOUNTER — Ambulatory Visit
Admission: RE | Admit: 2023-02-20 | Discharge: 2023-02-20 | Disposition: A | Discharge status: Home / Self Care | Payer: Medicaid Other | Source: Ambulatory Visit | Attending: Nurse Practitioner | Admitting: Nurse Practitioner

## 2023-02-20 DIAGNOSIS — N920 Excessive and frequent menstruation with regular cycle: Secondary | ICD-10-CM | POA: Diagnosis not present

## 2023-02-20 DIAGNOSIS — N921 Excessive and frequent menstruation with irregular cycle: Secondary | ICD-10-CM

## 2023-02-20 DIAGNOSIS — N946 Dysmenorrhea, unspecified: Secondary | ICD-10-CM

## 2023-02-25 ENCOUNTER — Encounter (HOSPITAL_COMMUNITY): Payer: Self-pay

## 2023-02-25 ENCOUNTER — Other Ambulatory Visit: Payer: Self-pay

## 2023-02-25 ENCOUNTER — Emergency Department (HOSPITAL_COMMUNITY)
Admission: EM | Admit: 2023-02-25 | Discharge: 2023-02-25 | Disposition: A | Discharge status: Home / Self Care | Payer: Medicaid Other | Attending: Emergency Medicine | Admitting: Emergency Medicine

## 2023-02-25 DIAGNOSIS — Z1152 Encounter for screening for COVID-19: Secondary | ICD-10-CM | POA: Diagnosis not present

## 2023-02-25 DIAGNOSIS — R42 Dizziness and giddiness: Secondary | ICD-10-CM | POA: Insufficient documentation

## 2023-02-25 DIAGNOSIS — R5383 Other fatigue: Secondary | ICD-10-CM | POA: Insufficient documentation

## 2023-02-25 DIAGNOSIS — H5509 Other forms of nystagmus: Secondary | ICD-10-CM | POA: Insufficient documentation

## 2023-02-25 DIAGNOSIS — R112 Nausea with vomiting, unspecified: Secondary | ICD-10-CM | POA: Insufficient documentation

## 2023-02-25 DIAGNOSIS — R5381 Other malaise: Secondary | ICD-10-CM | POA: Diagnosis not present

## 2023-02-25 LAB — COMPREHENSIVE METABOLIC PANEL
ALT: 20 U/L (ref 0–44)
AST: 16 U/L (ref 15–41)
Albumin: 4.1 g/dL (ref 3.5–5.0)
Alkaline Phosphatase: 67 U/L (ref 38–126)
Anion gap: 7 (ref 5–15)
BUN: 12 mg/dL (ref 6–20)
CO2: 25 mmol/L (ref 22–32)
Calcium: 9 mg/dL (ref 8.9–10.3)
Chloride: 103 mmol/L (ref 98–111)
Creatinine, Ser: 0.66 mg/dL (ref 0.44–1.00)
GFR, Estimated: >60 mL/min (ref >60)
Glucose, Bld: 103 mg/dL — ABNORMAL HIGH (ref 70–99)
Potassium: 4.4 mmol/L (ref 3.5–5.1)
Sodium: 135 mmol/L (ref 135–145)
Total Bilirubin: 0.5 mg/dL (ref 0.3–1.2)
Total Protein: 7.4 g/dL (ref 6.5–8.1)

## 2023-02-25 LAB — CBC WITH DIFFERENTIAL/PLATELET
Abs Immature Granulocytes: 0.05 10*3/uL (ref 0.00–0.07)
Basophils Absolute: 0 10*3/uL (ref 0.0–0.1)
Basophils Relative: 0 %
Eosinophils Absolute: 0 10*3/uL (ref 0.0–0.5)
Eosinophils Relative: 0 %
HCT: 42.5 % (ref 36.0–46.0)
Hemoglobin: 13.4 g/dL (ref 12.0–15.0)
Immature Granulocytes: 1 %
Lymphocytes Relative: 12 %
Lymphs Abs: 1.2 10*3/uL (ref 0.7–4.0)
MCH: 26.7 pg (ref 26.0–34.0)
MCHC: 31.5 g/dL (ref 30.0–36.0)
MCV: 84.8 fL (ref 80.0–100.0)
Monocytes Absolute: 0.4 10*3/uL (ref 0.1–1.0)
Monocytes Relative: 4 %
Neutro Abs: 8 10*3/uL — ABNORMAL HIGH (ref 1.7–7.7)
Neutrophils Relative %: 83 %
Platelets: 190 10*3/uL (ref 150–400)
RBC: 5.01 MIL/uL (ref 3.87–5.11)
RDW: 14.7 % (ref 11.5–15.5)
WBC: 9.7 10*3/uL (ref 4.0–10.5)
nRBC: 0 % (ref 0.0–0.2)

## 2023-02-25 LAB — RESP PANEL BY RT-PCR (RSV, FLU A&B, COVID)  RVPGX2
Influenza A by PCR: NEGATIVE
Influenza B by PCR: NEGATIVE
Resp Syncytial Virus by PCR: NEGATIVE
SARS Coronavirus 2 by RT PCR: NEGATIVE

## 2023-02-25 LAB — HCG, QUANTITATIVE, PREGNANCY: hCG, Beta Chain, Quant, S: <1 m[IU]/mL (ref <5)

## 2023-02-25 LAB — LIPASE, BLOOD: Lipase: 22 U/L (ref 11–51)

## 2023-02-25 MED ORDER — METOCLOPRAMIDE HCL 10 MG PO TABS
10.0000 mg | ORAL_TABLET | Freq: Four times a day (QID) | ORAL | 0 refills | Status: DC
Start: 2023-02-25 — End: 2023-09-18

## 2023-02-25 MED ORDER — MECLIZINE HCL 25 MG PO TABS
50.0000 mg | ORAL_TABLET | Freq: Once | ORAL | Status: AC
  Administered 2023-02-25: 50 mg via ORAL
  Filled 2023-02-25: qty 2

## 2023-02-25 MED ORDER — ONDANSETRON HCL 4 MG/2ML IJ SOLN
4.0000 mg | Freq: Once | INTRAMUSCULAR | Status: AC
  Administered 2023-02-25: 4 mg via INTRAVENOUS
  Filled 2023-02-25: qty 2

## 2023-02-25 MED ORDER — MECLIZINE HCL 25 MG PO TABS: 25.0000 mg | ORAL_TABLET | Freq: Three times a day (TID) | ORAL | 0 refills | Status: DC | PRN

## 2023-02-25 MED ORDER — SODIUM CHLORIDE 0.9 % IV BOLUS
1000.0000 mL | Freq: Once | INTRAVENOUS | Status: AC
  Administered 2023-02-25: 1000 mL via INTRAVENOUS

## 2023-02-25 MED ORDER — METOCLOPRAMIDE HCL 5 MG/ML IJ SOLN
10.0000 mg | Freq: Once | INTRAMUSCULAR | Status: AC
  Administered 2023-02-25: 10 mg via INTRAVENOUS
  Filled 2023-02-25: qty 2

## 2023-02-25 MED ORDER — PROMETHAZINE HCL 25 MG PO TABS
25.0000 mg | ORAL_TABLET | Freq: Once | ORAL | Status: AC
  Administered 2023-02-25: 25 mg via ORAL
  Filled 2023-02-25: qty 1

## 2023-02-25 NOTE — Discharge Instructions (Addendum)
Thank you for allowing [place] to be a part of your care today.  You were evaluated in the ED for dizziness, nausea, and vomiting.  I suspect that you have vertigo.  I have attached helpful information about vertigo.  I have sent Reglan (for nausea and vomiting) and meclizine (for dizziness) to the pharmacy to treat your symptoms.  Follow up with your primary care provider if your symptoms persist.  Meclizine may be sedating.  Do not drive or operate heavy machinery until you know how this medication will affect you.    Return to the ED if you develop sudden worsening of your symptoms or if you have any new concerns.

## 2023-02-25 NOTE — ED Provider Notes (Signed)
[place] EMERGENCY DEPARTMENT AT [facility] Provider Note   CSN: [id number] Arrival date & time: 02/25/23  7829     History  Chief Complaint  Patient presents with   Dizziness    Colleen Villegas is a 32 y.o. female with non-contributory past medical history presents to the ED complaining of dizziness, nausea, and vomiting since Friday.  Patient states she has been feeling fatigue and malaised for the last few days.  She had diarrhea this morning.  Denies fever, but has had chills.  Denies headache, light-headedness, weakness, numbness, syncope, abdominal pain.         Home Medications Prior to Admission medications   Medication Sig Start Date End Date Taking? Authorizing Provider  albuterol (VENTOLIN HFA) 108 (90 Base) MCG/ACT inhaler Inhale 1-2 puffs into the lungs every 6 (six) hours as needed for wheezing or shortness of breath. Patient taking differently: Inhale 2 puffs into the lungs every 6 (six) hours as needed for wheezing or shortness of breath. 08/22/22  Yes Early, Sung Amabile, NP  ibuprofen (ADVIL) 800 MG tablet Take one tablet with food every 8 hours while menstruating to help with pain and heavy bleeding. Patient taking differently: Take 800 mg by mouth as needed for cramping.  while menstruating to help with pain and heavy bleeding. 02/09/23  Yes Early, Sung Amabile, NP  levocetirizine (XYZAL) 5 MG tablet Take 1 tablet (5 mg total) by mouth every evening. Patient taking differently: Take 5 mg by mouth daily as needed for allergies. 08/22/22  Yes Early, Sung Amabile, NP  meclizine (ANTIVERT) 25 MG tablet Take 1 tablet (25 mg total) by mouth 3 (three) times daily as needed. 02/25/23  Yes Nadra Hritz R, PA-C  metoCLOPramide (REGLAN) 10 MG tablet Take 1 tablet (10 mg total) by mouth every 6 (six) hours. 02/25/23  Yes Larosa Rhines R, PA-C  Cholecalciferol (VITAMIN D3) 1.25 MG (50000 UT) TABS Take 1 tablet by mouth once a week. Patient not taking: Reported on 02/25/2023 09/19/22    Early, Sung Amabile, NP      Allergies    Patient has no known allergies.    Review of Systems   Review of Systems  Constitutional:  Positive for chills and fatigue. Negative for fever.  Gastrointestinal:  Positive for diarrhea, nausea and vomiting. Negative for abdominal pain.  Neurological:  Positive for dizziness. Negative for syncope, weakness, light-headedness, numbness and headaches.    Physical Exam Updated Vital Signs BP 126/79 (BP Location: Left Arm)   Pulse 78   Temp 98.2 F (36.8 C) (Oral)   Resp 15   LMP 02/09/2023 (Exact Date)   SpO2 100%  Physical Exam Vitals and nursing note reviewed.  Constitutional:      General: She is not in acute distress.    Appearance: Normal appearance. She is ill-appearing. She is not diaphoretic.  HENT:     Head: Normocephalic and atraumatic.     Right Ear: Tympanic membrane and ear canal normal.     Left Ear: Tympanic membrane and ear canal normal.     Mouth/Throat:     Lips: Pink.     Mouth: Mucous membranes are moist.  Eyes:     General: Lids are normal. Vision grossly intact.     Extraocular Movements: Extraocular movements intact.     Right eye: Nystagmus present.     Left eye: Nystagmus present.     Comments: Horizontal nystagmus presents bilaterally.    Cardiovascular:     Rate  and Rhythm: Normal rate and regular rhythm.  Pulmonary:     Effort: Pulmonary effort is normal.  Abdominal:     General: Abdomen is flat.     Palpations: Abdomen is soft.     Tenderness: There is no abdominal tenderness.  Skin:    General: Skin is warm and dry.     Capillary Refill: Capillary refill takes less than 2 seconds.  Neurological:     General: No focal deficit present.     Mental Status: She is alert and oriented to person, place, and time. Mental status is at baseline.     GCS: GCS eye subscore is 4. GCS verbal subscore is 5. GCS motor subscore is 6.     Cranial Nerves: No cranial nerve deficit.     Sensory: No sensory deficit.      Motor: No weakness or pronator drift.     Coordination: Coordination is intact.     Comments: Cranial Nerves:  II: peripheral fields grossly intact III,IV, VI: ptosis not present, extra-ocular movements intact bilaterally, direct and consensual pupillary light reflexes intact bilaterally V: facial sensation, jaw opening, and bite strength equal bilaterally VII: eyebrow raise, eyelid close, smile, frown, pucker equal bilaterally VIII: hearing grossly normal bilaterally  IX,X: palate elevation and swallowing intact XI: bilateral shoulder shrug and lateral head rotation equal and strong XII: midline tongue extension Motor: 5/5 strength in BUE and BLE in major muscle groups.   Sensation grossly intact.   Psychiatric:        Mood and Affect: Mood normal.        Behavior: Behavior normal.     ED Results / Procedures / Treatments   Labs (all labs ordered are listed, but only abnormal results are displayed) Labs Reviewed  COMPREHENSIVE METABOLIC PANEL - Abnormal; Notable for the following components:      Result Value   Glucose, Bld 103 (*)    All other components within normal limits  CBC WITH DIFFERENTIAL/PLATELET - Abnormal; Notable for the following components:   Neutro Abs 8.0 (*)    All other components within normal limits  RESP PANEL BY RT-PCR (RSV, FLU A&B, COVID)  RVPGX2  HCG, QUANTITATIVE, PREGNANCY  LIPASE, BLOOD    EKG None  Radiology No results found.  Procedures Procedures    Medications Ordered in ED Medications  sodium chloride 0.9 % bolus 1,000 mL (0 mLs Intravenous Stopped 02/25/23 1109)  ondansetron (ZOFRAN) injection 4 mg (4 mg Intravenous Given 02/25/23 1002)  meclizine (ANTIVERT) tablet 50 mg (50 mg Oral Given 02/25/23 1115)  promethazine (PHENERGAN) tablet 25 mg (25 mg Oral Given 02/25/23 1147)  metoCLOPramide (REGLAN) injection 10 mg (10 mg Intravenous Given 02/25/23 1338)    ED Course/ Medical Decision Making/ A&P                                  Medical Decision Making Amount and/or Complexity of Data Reviewed Labs: ordered.  Risk Prescription drug management.   This patient presents to the ED with chief complaint(s) of nausea, vomiting, dizziness with non-contributory past medical history. The complaint involves an extensive differential diagnosis and also carries with it a high risk of complications and morbidity.    The differential diagnosis includes peripheral vertigo, central vertigo, viral etiology, metabolic derangement   The initial plan is to obtain labs  Initial Assessment:   Exam significant for ill-appearing patient who is not in acute distress.  Vitals are stable.  EOM intact with horizontal nystagmus on movement.  Vision grossly intact.  Bilateral EACs and TMs unremarkable.  Normal ROM of the neck without pain.  HINTS exam with corrective saccade, horizontal nystagmus, and no skew.  Neuroexam is reassuring.  Independent ECG/labs interpretation:  The following labs were independently interpreted:  CBC without leukocytosis or anemia.  Metabolic panel without abnormality.  Negative for COVID, flu, RSV.  Lipase unremarkable.  Treatment and Reassessment: Patient given IV fluids, Zofran and meclizine.  Patient reports some improvement in symptoms, but does have persistent nausea.  Patient given Phenergan and p.o. challenge attempted.  Patient was able to tolerate water and crackers, but had increasing nausea following p.o. challenge.  Patient given Reglan with improvement in symptoms.  Patient has not had any episodes of vomiting while in the ED.  Disposition:   Patient has improved following ED interventions.  Exam is consistent with peripheral vertigo.  Will send patient home on meclizine and Reglan to manage symptoms.  Recommended PCP follow-up.  The patient has been appropriately medically screened and/or stabilized in the ED. I have low suspicion for any other emergent medical condition which would require further  screening, evaluation or treatment in the ED or require inpatient management. At time of discharge the patient is hemodynamically stable and in no acute distress. I have discussed work-up results and diagnosis with patient and answered all questions. Patient is agreeable with discharge plan. We discussed strict return precautions for returning to the emergency department and they verbalized understanding.             Final Clinical Impression(s) / ED Diagnoses Final diagnoses:  Vertigo  Nausea and vomiting, unspecified vomiting type    Rx / DC Orders ED Discharge Orders          Ordered    metoCLOPramide (REGLAN) 10 MG tablet  Every 6 hours        02/25/23 1454    meclizine (ANTIVERT) 25 MG tablet  3 times daily PRN        02/25/23 1454              Lenard Simmer, PA-C 02/25/23 1457    Rondel Baton, MD 02/25/23 1926

## 2023-02-25 NOTE — ED Triage Notes (Signed)
Pt arrived with reports of dizziness, nausea, vomiting since Friday. States this morning had diarrhea. Denies any other symptoms

## 2023-03-15 ENCOUNTER — Other Ambulatory Visit: Payer: Medicaid Other

## 2023-03-15 DIAGNOSIS — D509 Iron deficiency anemia, unspecified: Secondary | ICD-10-CM | POA: Diagnosis not present

## 2023-03-15 DIAGNOSIS — R7303 Prediabetes: Secondary | ICD-10-CM | POA: Diagnosis not present

## 2023-03-15 DIAGNOSIS — E559 Vitamin D deficiency, unspecified: Secondary | ICD-10-CM

## 2023-03-16 LAB — COMPREHENSIVE METABOLIC PANEL
ALT: 21 [IU]/L (ref 0–32)
AST: 21 [IU]/L (ref 0–40)
Albumin: 4.4 g/dL (ref 3.9–4.9)
Alkaline Phosphatase: 81 [IU]/L (ref 44–121)
BUN/Creatinine Ratio: 17 (ref 9–23)
BUN: 10 mg/dL (ref 6–20)
Bilirubin Total: 0.3 mg/dL (ref 0.0–1.2)
CO2: 24 mmol/L (ref 20–29)
Calcium: 9.2 mg/dL (ref 8.7–10.2)
Chloride: 103 mmol/L (ref 96–106)
Creatinine, Ser: 0.58 mg/dL (ref 0.57–1.00)
Globulin, Total: 2.5 g/dL (ref 1.5–4.5)
Glucose: 90 mg/dL (ref 70–99)
Potassium: 4.5 mmol/L (ref 3.5–5.2)
Sodium: 137 mmol/L (ref 134–144)
Total Protein: 6.9 g/dL (ref 6.0–8.5)
eGFR: 124 mL/min/{1.73_m2} (ref >59)

## 2023-03-16 LAB — IRON,TIBC AND FERRITIN PANEL
Ferritin: 14 ng/mL — ABNORMAL LOW (ref 15–150)
Iron Saturation: 16 % (ref 15–55)
Iron: 66 ug/dL (ref 27–159)
Total Iron Binding Capacity: 402 ug/dL (ref 250–450)
UIBC: 336 ug/dL (ref 131–425)

## 2023-03-16 LAB — CBC WITH DIFFERENTIAL/PLATELET
Basophils Absolute: 0 10*3/uL (ref 0.0–0.2)
Basos: 0 %
EOS (ABSOLUTE): 0.1 10*3/uL (ref 0.0–0.4)
Eos: 1 %
Hematocrit: 40.4 % (ref 34.0–46.6)
Hemoglobin: 12.6 g/dL (ref 11.1–15.9)
Immature Grans (Abs): 0 10*3/uL (ref 0.0–0.1)
Immature Granulocytes: 0 %
Lymphocytes Absolute: 1.6 10*3/uL (ref 0.7–3.1)
Lymphs: 35 %
MCH: 26.1 pg — ABNORMAL LOW (ref 26.6–33.0)
MCHC: 31.2 g/dL — ABNORMAL LOW (ref 31.5–35.7)
MCV: 84 fL (ref 79–97)
Monocytes Absolute: 0.4 10*3/uL (ref 0.1–0.9)
Monocytes: 7 %
Neutrophils Absolute: 2.7 10*3/uL (ref 1.4–7.0)
Neutrophils: 57 %
Platelets: 191 10*3/uL (ref 150–450)
RBC: 4.82 x10E6/uL (ref 3.77–5.28)
RDW: 13.6 % (ref 11.7–15.4)
WBC: 4.7 10*3/uL (ref 3.4–10.8)

## 2023-03-16 LAB — VITAMIN D 25 HYDROXY (VIT D DEFICIENCY, FRACTURES): Vit D, 25-Hydroxy: 31.3 ng/mL (ref 30.0–100.0)

## 2023-03-16 LAB — HEMOGLOBIN A1C
Est. average glucose Bld gHb Est-mCnc: 114 mg/dL
Hgb A1c MFr Bld: 5.6 % (ref 4.8–5.6)

## 2023-04-02 ENCOUNTER — Other Ambulatory Visit: Payer: Self-pay | Admitting: Nurse Practitioner

## 2023-04-02 DIAGNOSIS — E559 Vitamin D deficiency, unspecified: Secondary | ICD-10-CM

## 2023-04-02 DIAGNOSIS — D509 Iron deficiency anemia, unspecified: Secondary | ICD-10-CM

## 2023-04-02 MED ORDER — VITAMIN D (ERGOCALCIFEROL) 1.25 MG (50000 UNIT) PO CAPS
50000.0000 [IU] | ORAL_CAPSULE | ORAL | 3 refills | Status: DC
Start: 2023-04-02 — End: 2023-09-20

## 2023-04-02 MED ORDER — IRON (FERROUS SULFATE) 325 (65 FE) MG PO TABS
325.0000 mg | ORAL_TABLET | ORAL | 3 refills | Status: AC
Start: 2023-04-02 — End: ?

## 2023-04-11 ENCOUNTER — Encounter: Payer: Self-pay | Admitting: Vascular Surgery

## 2023-04-11 ENCOUNTER — Ambulatory Visit: Payer: Medicaid Other | Admitting: Vascular Surgery

## 2023-04-11 VITALS — BP 122/77 | HR 71 | Temp 98.7°F | Resp 20 | Ht 62.0 in | Wt 179.0 lb

## 2023-04-11 DIAGNOSIS — I83813 Varicose veins of bilateral lower extremities with pain: Secondary | ICD-10-CM | POA: Diagnosis not present

## 2023-04-11 NOTE — Progress Notes (Signed)
Patient ID: Colleen Villegas, female   DOB: [birth year], 32 y.o.   MRN: [id number]  Reason for Consult: Follow-up   Referred by Early, Sung Amabile, NP  Subjective:     HPI:  Colleen Villegas is a 32 y.o. female with history of swelling of the left lower extremity since she was 32 years old.  She has been evaluated here in the past for treatment of telangiectasias and has had left lower extremity venous reflux testing and was fitted for thigh-high 20 to 30 mmHg compression stockings.  She has been compliant with compression stockings but states that she still has discomfort in the left lower extremity particularly at the end of the day when her swelling is at its worst.  She has minimal swelling in the right lower extremity with associated reticular veins and telangiectasias.  She has never had any venous interventions.  No history of DVT.  She does not take any medications.  She does have 2 aunts who live in [place] who have had venous intervention in the past.  Past Medical History:  Diagnosis Date   No pertinent past medical history    Family History  Problem Relation Age of Onset   Hypertension Paternal Grandmother    Diabetes Paternal Aunt    Anesthesia problems Neg Hx    Past Surgical History:  Procedure Laterality Date   NO PAST SURGERIES      Short Social History:  Social History   Tobacco Use   Smoking status: Never   Smokeless tobacco: Never  Substance Use Topics   Alcohol use: No    No Known Allergies  Current Outpatient Medications  Medication Sig Dispense Refill   albuterol (VENTOLIN HFA) 108 (90 Base) MCG/ACT inhaler Inhale 1-2 puffs into the lungs every 6 (six) hours as needed for wheezing or shortness of breath. (Patient taking differently: Inhale 2 puffs into the lungs every 6 (six) hours as needed for wheezing or shortness of breath.) 18 g 6   Cholecalciferol (VITAMIN D3) 1.25 MG (50000 UT) TABS Take 1 tablet by mouth once a week. 12 tablet 1   ibuprofen (ADVIL)  800 MG tablet Take one tablet with food every 8 hours while menstruating to help with pain and heavy bleeding. (Patient taking differently: Take 800 mg by mouth as needed for cramping.  while menstruating to help with pain and heavy bleeding.) 90 tablet 3   Iron, Ferrous Sulfate, 325 (65 Fe) MG TABS Take 325 mg by mouth 2 (two) times a week. 24 tablet 3   levocetirizine (XYZAL) 5 MG tablet Take 1 tablet (5 mg total) by mouth every evening. (Patient taking differently: Take 5 mg by mouth daily as needed for allergies.) 90 tablet 1   meclizine (ANTIVERT) 25 MG tablet Take 1 tablet (25 mg total) by mouth 3 (three) times daily as needed. 30 tablet 0   metoCLOPramide (REGLAN) 10 MG tablet Take 1 tablet (10 mg total) by mouth every 6 (six) hours. 30 tablet 0   Vitamin D, Ergocalciferol, (DRISDOL) 1.25 MG (50000 UNIT) CAPS capsule Take 1 capsule (50,000 Units total) by mouth every 7 (seven) days. 12 capsule 3   No current facility-administered medications for this visit.    Review of Systems  Constitutional:  Constitutional negative. HENT: HENT negative.  Eyes: Eyes negative.  Respiratory: Respiratory negative.  Cardiovascular: Positive for leg swelling.  GI: Gastrointestinal negative.  Musculoskeletal: Positive for leg pain.  Neurological: Neurological negative. Hematologic: Hematologic/lymphatic negative.  Psychiatric: Psychiatric negative.  Objective:  Objective   Vitals:   04/11/23 1018  BP: 122/77  Pulse: 71  Resp: 20  Temp: 98.7 F (37.1 C)  SpO2: 95%  Weight: 179 lb (81.2 kg)  Height: 5\' 2"  (1.575 m)   Body mass index is 32.74 kg/m.  Physical Exam HENT:     Head: Normocephalic.     Nose: Nose normal.  Eyes:     Pupils: Pupils are equal, round, and reactive to light.  Cardiovascular:     Pulses: Normal pulses.  Pulmonary:     Effort: Pulmonary effort is normal.  Musculoskeletal:     Right lower leg: No edema.     Left lower leg: Edema present.  Skin:     General: Skin is warm.     Capillary Refill: Capillary refill takes less than 2 seconds.  Neurological:     General: No focal deficit present.     Mental Status: She is alert.  Psychiatric:        Mood and Affect: Mood normal.        Data: LEFT          Reflux NoRefluxReflux TimeDiameter cmsComments                          Yes                                   +--------------+---------+------+-----------+------------+--------+  CFV                    yes   >1 second                       +--------------+---------+------+-----------+------------+--------+  FV prox       no                                              +--------------+---------+------+-----------+------------+--------+  FV mid        no                                              +--------------+---------+------+-----------+------------+--------+  FV dist       no                                              +--------------+---------+------+-----------+------------+--------+  Popliteal    no                                              +--------------+---------+------+-----------+------------+--------+  GSV at SFJ              yes    >500 ms     0.705              +--------------+---------+------+-----------+------------+--------+  GSV prox thigh          yes    >500 ms     0.588              +--------------+---------+------+-----------+------------+--------+  GSV mid thigh           yes    >500 ms     0.397              +--------------+---------+------+-----------+------------+--------+  GSV dist thigh          yes    >500 ms      0.61              +--------------+---------+------+-----------+------------+--------+  GSV at knee             yes    >500 ms     0.383              +--------------+---------+------+-----------+------------+--------+  GSV prox calf           yes    >500 ms     0.201               +--------------+---------+------+-----------+------------+--------+  SSV Pop Fossa no                           0.255              +--------------+---------+------+-----------+------------+--------+  SSV prox calf                              0.111              +--------------+---------+------+-----------+------------+--------+  SSV mid calf                               0.159              +--------------+---------+------+-----------+------------+--------+     Summary:  Left:  - No evidence of deep vein thrombosis seen in the left lower extremity,  from the common femoral through the popliteal veins.  - No evidence of superficial venous thrombosis in the left lower  extremity.    - Deep vein reflux in the CFV.   - Superficial vein reflux in the SFJ and GSV.      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  These appear to give rise to the small reticular and telangiectasia veins at the level of the knee and in the medial calf.  We have discussed her options which would include left greater saphenous vein ablation followed by sclerotherapy injection with at least 2 units.  This would be an effort to assist with her leg swelling and prevent progression of venous disease in the left lower extremity.  She will need to be compliant with compression stockings both before and after the procedure.  At follow-up we will also evaluate her right lower extremity given that she does have some reticular veins that are symptomatic there as well.  We discussed the risk benefits and alternatives and she demonstrates good understanding as well as the need for continued compression stockings.    Maeola Harman MD [facility]

## 2023-04-19 ENCOUNTER — Other Ambulatory Visit: Payer: Self-pay | Admitting: *Deleted

## 2023-04-19 DIAGNOSIS — I83811 Varicose veins of right lower extremities with pain: Secondary | ICD-10-CM

## 2023-04-19 DIAGNOSIS — I83812 Varicose veins of left lower extremities with pain: Secondary | ICD-10-CM

## 2023-05-16 ENCOUNTER — Other Ambulatory Visit: Payer: Self-pay | Admitting: *Deleted

## 2023-05-16 MED ORDER — LORAZEPAM 1 MG PO TABS
ORAL_TABLET | ORAL | 0 refills | Status: DC
Start: 2023-05-16 — End: 2023-05-24

## 2023-05-24 ENCOUNTER — Ambulatory Visit: Payer: Medicaid Other | Admitting: Vascular Surgery

## 2023-05-24 ENCOUNTER — Encounter: Payer: Self-pay | Admitting: Vascular Surgery

## 2023-05-24 VITALS — BP 108/69 | HR 80 | Temp 97.9°F | Resp 16 | Ht 62.0 in | Wt 176.0 lb

## 2023-05-24 DIAGNOSIS — I83812 Varicose veins of left lower extremities with pain: Secondary | ICD-10-CM

## 2023-05-24 HISTORY — PX: LASER ABLATION: SHX1947

## 2023-05-24 NOTE — Progress Notes (Signed)
Patient name: Colleen Villegas MRN: [id number] DOB: 11-01-1990 Sex: female  REASON FOR VISIT: Treatment of left lower extremity swelling with reflux and symptomatic reticular veins and telangiectasias  HPI: Colleen Villegas is a 32 y.o. female with history of C3 venous disease secondary to large refluxing left greater with reticular veins and telangiectasias of the right.  We are planning for saphenous vein ablation on the left where saphenectomy epicardial areas of larger reticular veins and she will need follow-up sclerotherapy.  She has been compliant with thigh-high stockings and she demonstrates good understanding of the plan including risk benefits and alternatives.  Current Outpatient Medications  Medication Sig Dispense Refill   albuterol (VENTOLIN HFA) 108 (90 Base) MCG/ACT inhaler Inhale 1-2 puffs into the lungs every 6 (six) hours as needed for wheezing or shortness of breath. (Patient taking differently: Inhale 2 puffs into the lungs every 6 (six) hours as needed for wheezing or shortness of breath.) 18 g 6   Cholecalciferol (VITAMIN D3) 1.25 MG (50000 UT) TABS Take 1 tablet by mouth once a week. 12 tablet 1   ibuprofen (ADVIL) 800 MG tablet Take one tablet with food every 8 hours while menstruating to help with pain and heavy bleeding. (Patient taking differently: Take 800 mg by mouth as needed for cramping.  while menstruating to help with pain and heavy bleeding.) 90 tablet 3   Iron, Ferrous Sulfate, 325 (65 Fe) MG TABS Take 325 mg by mouth 2 (two) times a week. 24 tablet 3   levocetirizine (XYZAL) 5 MG tablet Take 1 tablet (5 mg total) by mouth every evening. (Patient taking differently: Take 5 mg by mouth daily as needed for allergies.) 90 tablet 1   LORazepam (ATIVAN) 1 MG tablet Take 1 tablet 30 to 60 minutes before leaving house on day of office surgery.  Bring second tablet with you to office on day of office surgery. 2 tablet 0   meclizine (ANTIVERT) 25 MG tablet Take 1  tablet (25 mg total) by mouth 3 (three) times daily as needed. 30 tablet 0   metoCLOPramide (REGLAN) 10 MG tablet Take 1 tablet (10 mg total) by mouth every 6 (six) hours. 30 tablet 0   Vitamin D, Ergocalciferol, (DRISDOL) 1.25 MG (50000 UNIT) CAPS capsule Take 1 capsule (50,000 Units total) by mouth every 7 (seven) days. 12 capsule 3   No current facility-administered medications for this visit.    PHYSICAL EXAM: Vitals:   05/24/23 0854  BP: 108/69  Pulse: 80  Resp: 16  Temp: 97.9 F (36.6 C)  SpO2: 99%   Awake alert and oriented Nonlabored respirations Left leg reticular veins and area of varicosities marked with the patient standing and saphenous vein mapping the patient supine   PROCEDURE: Left greater saphenous vein laser ablation totaling 25 cm  TECHNIQUE: Colleen Villegas taken to the procedure room where she was initially standing and her areas of reticular veins were marked on the skin.  She was then placed supine and her left greater saphenous vein was mapped with ultrasound.  She was then sterilely prepped and draped in the lower extremity in usual fashion, timeout was called.  We began using ultrasound identified great saphenous vein just above the knee where it entered the fascia.  The area was anesthetized with 1% lidocaine and cannulated with a micropuncture needle followed by wire micropuncture sheath.  A J-wire was then placed a few centimeters from the saphenofemoral junction with ultrasound guidance this was just  below 1 large branch.  The introducer sheath was then placed and the laser was placed totaling 25 cm and this was confirmed with ultrasound.  Tumescent anesthesia was instilled around the saphenous vein throughout as well as the saphenofemoral junction and the vein was then ablated for a total of 25 cm.  At completion the common femoral vein was compressible.  Three small areas of reticular veins were then anesthetized with 1% lidocaine and tumescent anesthesia on the  medial leg.  Small stab incisions were created and the reticular veins were removed with crochet hook.  Steri-Strips were placed followed by a sterile compressive dressing.  Patient tolerated the procedure well without any complications.  Plan will be to follow-up with post ablation duplex of the left lower extremity and also venous reflux study of the right lower extremity given her areas of reticular and varicose veins on the right which appear to be amenable for treatment in the future.  Colleen Villegas [facility] [place] [phone]

## 2023-05-24 NOTE — Progress Notes (Signed)
     Laser Ablation Procedure    Date: 05/24/2023  Colleen Villegas DOB:1990/07/09  Consent signed: Yes      Surgeon: Dr. Lemar Livings  Procedure: Laser Ablation: left Greater Saphenous Vein  BP 108/69 (BP Location: Left Arm, Patient Position: Sitting, Cuff Size: Normal)   Pulse 80   Temp 97.9 F (36.6 C) (Temporal)   Resp 16   Ht 5\' 2"  (1.575 m)   Wt 176 lb (79.8 kg)   SpO2 99%   BMI 32.19 kg/m   Tumescent Anesthesia: 400 cc 0.9% NaCl with 50 cc Lidocaine HCL 1%  and 15 cc 8.4% NaHCO3  Local Anesthesia: 13 cc Lidocaine HCL and NaHCO3 (ratio 2:1)  7 watts continuous mode   Total energy: 1256.5 joules    Total time: 179 seconds Treatment Length 25 cm Laser Fiber Ref. #  [id number]        Lot # [id number]  Stab Phlebectomy: <10 Sites: Calf Patient tolerated procedure well  Notes:All staff members wore facial masks. Pt had 1 tablet (1mg ) ativan at 07:05 am  .    Description of Procedure:  After marking the course of the secondary varicosities, the patient was placed on the operating table in the supine position, and the left leg was prepped and draped in sterile fashion.   Local anesthetic was administered and under ultrasound guidance the saphenous vein was accessed with a micro needle and guide wire; then the mirco puncture sheath was placed.  A guide wire was inserted saphenofemoral junction , followed by a 5 french sheath.  The position of the sheath and then the laser fiber below the junction was confirmed using the ultrasound.  Tumescent anesthesia was administered along the course of the saphenous vein using ultrasound guidance. The patient was placed in Trendelenburg position and protective laser glasses were placed on patient and staff, and the laser was fired at 7 watts continuous mode for a total of 1256.5 joules.   For stab phlebectomies, local anesthetic was administered at the previously marked varicosities, and tumescent anesthesia was administered around the  vessels.  Ten to 20 stab wounds were made using the tip of an 11 blade. And using the vein hook, the phlebectomies were performed using a hemostat to avulse the varicosities.  Adequate hemostasis was achieved.    Steri strips were applied to the stab wounds and ABD pads and thigh high compression stockings were applied.  Ace wrap bandages were applied over the phlebectomy sites and at the top of the saphenofemoral junction. Blood loss was less than 15 cc.  Discharge instructions reviewed with patient and hardcopy of discharge instructions given to patient to take home. The patient ambulated out of the operating room having tolerated the procedure well.

## 2023-05-30 ENCOUNTER — Ambulatory Visit (HOSPITAL_COMMUNITY)
Admission: RE | Admit: 2023-05-30 | Discharge: 2023-05-30 | Disposition: A | Discharge status: Home / Self Care | Payer: Medicaid Other | Source: Ambulatory Visit | Attending: Vascular Surgery | Admitting: Vascular Surgery

## 2023-05-30 ENCOUNTER — Encounter: Payer: Self-pay | Admitting: Vascular Surgery

## 2023-05-30 ENCOUNTER — Ambulatory Visit (INDEPENDENT_AMBULATORY_CARE_PROVIDER_SITE_OTHER): Payer: Medicaid Other | Admitting: Vascular Surgery

## 2023-05-30 ENCOUNTER — Ambulatory Visit (INDEPENDENT_AMBULATORY_CARE_PROVIDER_SITE_OTHER)
Admission: RE | Admit: 2023-05-30 | Discharge: 2023-05-30 | Disposition: A | Discharge status: Home / Self Care | Payer: Medicaid Other | Source: Ambulatory Visit | Attending: Vascular Surgery | Admitting: Vascular Surgery

## 2023-05-30 VITALS — BP 120/79 | HR 70 | Temp 98.2°F | Resp 20 | Ht 62.0 in | Wt 177.0 lb

## 2023-05-30 DIAGNOSIS — I83811 Varicose veins of right lower extremities with pain: Secondary | ICD-10-CM | POA: Diagnosis not present

## 2023-05-30 DIAGNOSIS — I872 Venous insufficiency (chronic) (peripheral): Secondary | ICD-10-CM

## 2023-05-30 DIAGNOSIS — I83812 Varicose veins of left lower extremities with pain: Secondary | ICD-10-CM

## 2023-05-30 NOTE — Progress Notes (Signed)
Patient ID: Colleen Villegas, female   DOB: 29-May-1991, 32 y.o.   MRN: [id number]  Reason for Consult: Routine Post Op   Referred by Colleen Eth, NP  Subjective:     HPI:  Colleen Villegas is a 32 y.o. female status post left greater saphenous vein ablation totaling 25 cm for C3 venous disease.  At that time she also had a few small stab incisions for larger reticular veins.  She has had pain in the left thigh and has been wearing thigh-high compression stockings.  Overall she states that her swelling already seems to be better she is having pain at the sites of phlebectomy and ablation.  Her right lower extremity does swell particularly at the end of the day but does not give her as many problems as left previously did.  She has been wearing thigh-high compression stockings on the right as well.  Past Medical History:  Diagnosis Date   Allergy    No pertinent past medical history    Family History  Problem Relation Age of Onset   Hypertension Paternal Grandmother    Diabetes Paternal Aunt    Anesthesia problems Neg Hx    Past Surgical History:  Procedure Laterality Date   LASER ABLATION Left 05/24/2023   Laser ablation of the Left greater saphenous vein with <10 stab phlebectomies   NO PAST SURGERIES      Short Social History:  Social History   Tobacco Use   Smoking status: Never   Smokeless tobacco: Never  Substance Use Topics   Alcohol use: No    No Known Allergies  Current Outpatient Medications  Medication Sig Dispense Refill   albuterol (VENTOLIN HFA) 108 (90 Base) MCG/ACT inhaler Inhale 1-2 puffs into the lungs every 6 (six) hours as needed for wheezing or shortness of breath. (Patient taking differently: Inhale 2 puffs into the lungs every 6 (six) hours as needed for wheezing or shortness of breath.) 18 g 6   Cholecalciferol (VITAMIN D3) 1.25 MG (50000 UT) TABS Take 1 tablet by mouth once a week. 12 tablet 1   ibuprofen (ADVIL) 800 MG tablet Take one tablet  with food every 8 hours while menstruating to help with pain and heavy bleeding. (Patient taking differently: Take 800 mg by mouth as needed for cramping.  while menstruating to help with pain and heavy bleeding.) 90 tablet 3   Iron, Ferrous Sulfate, 325 (65 Fe) MG TABS Take 325 mg by mouth 2 (two) times a week. 24 tablet 3   levocetirizine (XYZAL) 5 MG tablet Take 1 tablet (5 mg total) by mouth every evening. (Patient taking differently: Take 5 mg by mouth daily as needed for allergies.) 90 tablet 1   meclizine (ANTIVERT) 25 MG tablet Take 1 tablet (25 mg total) by mouth 3 (three) times daily as needed. 30 tablet 0   metoCLOPramide (REGLAN) 10 MG tablet Take 1 tablet (10 mg total) by mouth every 6 (six) hours. 30 tablet 0   Vitamin D, Ergocalciferol, (DRISDOL) 1.25 MG (50000 UNIT) CAPS capsule Take 1 capsule (50,000 Units total) by mouth every 7 (seven) days. 12 capsule 3   No current facility-administered medications for this visit.    Review of Systems  Constitutional:  Constitutional negative. HENT: HENT negative.  Eyes: Eyes negative.  Respiratory: Respiratory negative.  Cardiovascular: Positive for leg swelling.  GI: Gastrointestinal negative.  Musculoskeletal: Positive for leg pain.  Neurological: Neurological negative. Hematologic: Hematologic/lymphatic negative.  Psychiatric: Psychiatric negative.  Objective:  Objective   Vitals:   05/30/23 0949  BP: 120/79  Pulse: 70  Resp: 20  Temp: 98.2 F (36.8 C)  SpO2: 98%  Weight: 177 lb (80.3 kg)  Height: 5\' 2"  (1.575 m)   Body mass index is 32.37 kg/m.  Physical Exam HENT:     Nose: Nose normal.     Mouth/Throat:     Mouth: Mucous membranes are moist.  Eyes:     Pupils: Pupils are equal, round, and reactive to light.  Cardiovascular:     Rate and Rhythm: Normal rate.  Pulmonary:     Effort: Pulmonary effort is normal.  Abdominal:     General: Abdomen is flat.  Musculoskeletal:     Right lower leg: Edema  present.     Left lower leg: Edema present.  Skin:    Comments: Bruising medial left and posterior left leg sites of stab phlebectomy Small areas of varicosities of the right popliteal fossa Telangiectasias present left lower extremity surrounded on medial thigh and leg as well as popliteal fossa  Neurological:     Mental Status: She is alert.        Data: Venous Reflux Times  +--------------+---------+------+-----------+------------+--------+  RIGHT        Reflux NoRefluxReflux TimeDiameter cmsComments                          Yes                                   +--------------+---------+------+-----------+------------+--------+  CFV                    yes   >1 second                       +--------------+---------+------+-----------+------------+--------+  FV prox       no                                              +--------------+---------+------+-----------+------------+--------+  FV mid        no                                              +--------------+---------+------+-----------+------------+--------+  FV dist       no                                              +--------------+---------+------+-----------+------------+--------+  Popliteal    no                                              +--------------+---------+------+-----------+------------+--------+  GSV at SFJ              yes    >500 ms      0.60              +--------------+---------+------+-----------+------------+--------+  GSV prox thigh  yes    >500 ms      0.43              +--------------+---------+------+-----------+------------+--------+  GSV mid thigh           yes    >500 ms      0.35              +--------------+---------+------+-----------+------------+--------+  GSV dist thighno                            0.41              +--------------+---------+------+-----------+------------+--------+  GSV at knee              yes    >500 ms      0.26              +--------------+---------+------+-----------+------------+--------+  GSV prox calf no                            0.32              +--------------+---------+------+-----------+------------+--------+  GSV mid calf                   >500 ms                        +--------------+---------+------+-----------+------------+--------+  SSV Pop Fossa           yes    >500 ms      0.43              +--------------+---------+------+-----------+------------+--------+  SSV prox calf           yes    >500 ms      0.35              +--------------+---------+------+-----------+------------+--------+  SSV mid calf            yes    >500 ms      0.30              +--------------+---------+------+-----------+------------+--------+         Summary:  Right:  - No evidence of deep vein thrombosis seen in the right lower extremity,  from the common femoral through the popliteal veins.  - No evidence of superficial venous thrombosis in the right lower  extremity.    - Venous reflux is noted in the right common femoral vein.  - Venous reflux is noted in the right sapheno-femoral junction.  - Venous reflux is noted in the right greater saphenous vein in the thigh  and in the calf.  - Venous reflux is noted in the right small saphenous vein.     LEFT          Reflux  Reflux  Reflux  Diameter cmsComments                                No       Yes     Time                                         +--------------+--------+------+----------+------------+-------------------  ----+  CFV          no                                                            +--------------+--------+------+----------+------------+-------------------  ----+  FV prox       no                                                            +--------------+--------+------+----------+------------+-------------------   ----+  FV mid        no                                                            +--------------+--------+------+----------+------------+-------------------  ----+  FV dist       no                                                            +--------------+--------+------+----------+------------+-------------------  ----+  Popliteal    no                                                            +--------------+--------+------+----------+------------+-------------------  ----+  GSV at [facility]    no                          0.65                              +--------------+--------+------+----------+------------+-------------------  ----+  GSV prox thigh                            0.50                              +--------------+--------+------+----------+------------+-------------------  ----+  GSV mid thigh                                     prior                                                                       ablation/stripping        +--------------+--------+------+----------+------------+-------------------  ----+  GSV dist thigh                                    prior  ablation/stripping        +--------------+--------+------+----------+------------+-------------------  ----+  GSV at knee                                       prior                                                                       ablation/stripping        +--------------+--------+------+----------+------------+-------------------  ----+  GSV prox calf          yes   >500 ms      0.32                              +--------------+--------+------+----------+------------+-------------------  ----+     Summary:  Left:  - No evidence of deep or superficial vein thrombosis seen in the left  lower extremity.  - The left great saphenous vein  is non-compressible and occluded from the  mid thigh to the knee. This is consistent with recent venous ablation  procedure.  - Venous reflux is noted in the left great saphenous vein in the calf at  procedure entry site.         Assessment/Plan:    32 year old female status post left greater saphenous vein ablation for C3 venous disease now with occluded saphenous vein in the mid and distal thigh without any evidence of DVT.  The right lower extremity does have some reflux with areas of varicosities in the popliteal fossa but her symptoms are mild at this time and as such I have recommended continued thigh-high compression stockings follow-up on an as-needed basis.  On the left lower extremity she is to follow-up for sclerotherapy prior to March with [name], RN and she will call to make this appointment.  All questions were answered today.     Maeola Harman MD [facility]

## 2023-06-21 ENCOUNTER — Other Ambulatory Visit: Payer: Medicaid Other | Admitting: Vascular Surgery

## 2023-06-22 ENCOUNTER — Ambulatory Visit: Payer: Medicaid Other

## 2023-06-22 DIAGNOSIS — I83813 Varicose veins of bilateral lower extremities with pain: Secondary | ICD-10-CM | POA: Diagnosis not present

## 2023-06-22 NOTE — Progress Notes (Signed)
 Treated pt's BLE reticular and spider veins with Asclera 1%, administered with a 27 gauge butterfly needle. Pt received a total of 4 mL/40 mg of Asclera 1%.  Pt tolerated well; easy access. Pt had areas that we could not treat due to her insurance approving 2 vials. She is interested in coming back and paying out of pocket for further treatment. At the end of treatment, she was placed in 20-30 mm Hg compression hose and given post treatment care instructions on handout and verbal. She will if she has any questions/concerns.

## 2023-07-05 ENCOUNTER — Encounter: Payer: Medicaid Other | Admitting: Vascular Surgery

## 2023-07-05 ENCOUNTER — Encounter (HOSPITAL_COMMUNITY): Payer: Medicaid Other

## 2023-09-18 ENCOUNTER — Ambulatory Visit: Payer: Medicaid Other | Admitting: Nurse Practitioner

## 2023-09-18 ENCOUNTER — Encounter: Payer: Self-pay | Admitting: Nurse Practitioner

## 2023-09-18 VITALS — BP 112/70 | HR 52 | Ht 62.0 in | Wt 177.4 lb

## 2023-09-18 DIAGNOSIS — I83813 Varicose veins of bilateral lower extremities with pain: Secondary | ICD-10-CM

## 2023-09-18 DIAGNOSIS — N920 Excessive and frequent menstruation with regular cycle: Secondary | ICD-10-CM

## 2023-09-18 DIAGNOSIS — J3089 Other allergic rhinitis: Secondary | ICD-10-CM

## 2023-09-18 DIAGNOSIS — Z0000 Encounter for general adult medical examination without abnormal findings: Secondary | ICD-10-CM

## 2023-09-18 DIAGNOSIS — E559 Vitamin D deficiency, unspecified: Secondary | ICD-10-CM | POA: Insufficient documentation

## 2023-09-18 DIAGNOSIS — N921 Excessive and frequent menstruation with irregular cycle: Secondary | ICD-10-CM | POA: Diagnosis not present

## 2023-09-18 DIAGNOSIS — R7303 Prediabetes: Secondary | ICD-10-CM | POA: Diagnosis not present

## 2023-09-18 DIAGNOSIS — D649 Anemia, unspecified: Secondary | ICD-10-CM | POA: Insufficient documentation

## 2023-09-18 MED ORDER — ALBUTEROL SULFATE HFA 108 (90 BASE) MCG/ACT IN AERS: 1.0000 | INHALATION_SPRAY | Freq: Four times a day (QID) | RESPIRATORY_TRACT | 3 refills | Status: AC | PRN

## 2023-09-18 NOTE — Assessment & Plan Note (Signed)
 Re-evaluate labs. Continue with iron supplementation.

## 2023-09-18 NOTE — Assessment & Plan Note (Signed)

## 2023-09-18 NOTE — Assessment & Plan Note (Signed)
 Last labs were WNL. Will recheck today to ensure that her levels continue to be stable. Continue to monitor carbohydrate intake and keep to a minimum.

## 2023-09-18 NOTE — Assessment & Plan Note (Signed)
 Heavy menstrual bleeding with clots and prolonged cycles. Previous ultrasound normal. Considering hormonal imbalance. - Order hormone level tests and thyroid

## 2023-09-18 NOTE — Progress Notes (Signed)
 Shawna Clamp, DNP, AGNP-c [facility] [address] [place], [place] [address] Main Office [phone]  BP 112/70   Pulse (!) 52   Ht 5\' 2"  (1.575 m)   Wt 177 lb 6.4 oz (80.5 kg)   LMP 08/20/2023   BMI 32.45 kg/m    Subjective:    Patient ID: Colleen Villegas, female    DOB: [birth year], 33 y.o.   MRN: [id number]  HPI: Colleen Villegas is a 33 y.o. female presenting on 09/18/2023 for comprehensive medical examination.   History of Present Illness Colleen Villegas is a 33 year old female who presents with allergy symptoms and leg pain.  She has been experiencing significant allergy symptoms, which have been particularly bothersome. She has previously received allergy shots without relief and alternates between medications annually to find what works best. Currently, she is taking Claritin D, which causes dry mouth, and occasionally uses Azertiq when unable to access the pharmacy. Her symptoms improve with rain but have been severe recently. She has increased her inhaler use in recent days due to exacerbated allergy symptoms and keeps one in her purse for convenience. No chest pain, shortness of breath, palpitations, throat fullness, or difficulty swallowing.  She underwent a procedure for varicose veins, described as a 'stripping of the vein,' which has significantly improved her leg pain. She was informed that the results could last a couple of years. No swelling in the feet.  Her menstrual cycle is typically 28 to 30 days, with periods lasting six days, occasionally extending to seven days about three times a year. She experiences heavy bleeding with clots, which sometimes stops and then resumes on the fifth day. An ultrasound conducted a few months ago returned normal results. She inquires about hormone levels due to previous menstrual irregularities.  She has a history of prediabetes, with high blood sugar levels noted last year in April. However, by October, her  levels had normalized. She is concerned about her blood sugar levels. No changes in bowel or bladder habits.  Pertinent items are noted in HPI.   Most Recent Depression Screen:     09/18/2023    9:15 AM 09/15/2022    9:11 AM 08/22/2022   11:18 AM 02/11/2020    1:38 PM 08/23/2017    1:30 PM  Depression screen PHQ 2/9  Decreased Interest 0 0 0 0 0  Down, Depressed, Hopeless 0 0 0 0 0  PHQ - 2 Score 0 0 0 0 0   Most Recent Anxiety Screen:      No data to display         Most Recent Fall Screen:    09/18/2023    9:14 AM 09/15/2022    9:11 AM 08/22/2022   11:18 AM 02/11/2020    1:37 PM 08/23/2017    1:30 PM  Fall Risk   Falls in the past year? 0 0 0 0 No  Number falls in past yr: 0 0 0 0   Injury with Fall? 0 0 0 0   Risk for fall due to : No Fall Risks No Fall Risks No Fall Risks    Follow up Falls evaluation completed Falls evaluation completed Falls evaluation completed Falls evaluation completed     Past medical history, surgical history, medications, allergies, family history and social history reviewed with patient today and changes made to appropriate areas of the chart.  Past Medical History:  Past Medical History:  Diagnosis Date   Acute non-recurrent pansinusitis 08/25/2022  Allergy    No pertinent past medical history    Medications:  Current Outpatient Medications on File Prior to Visit  Medication Sig   ibuprofen (ADVIL) 800 MG tablet Take one tablet with food every 8 hours while menstruating to help with pain and heavy bleeding. (Patient taking differently: Take 800 mg by mouth as needed for cramping.  while menstruating to help with pain and heavy bleeding.)   Iron, Ferrous Sulfate, 325 (65 Fe) MG TABS Take 325 mg by mouth 2 (two) times a week.   loratadine-pseudoephedrine (CLARITIN-D 24-HOUR) 10-240 MG 24 hr tablet Take 1 tablet by mouth daily.   Vitamin D, Ergocalciferol, (DRISDOL) 1.25 MG (50000 UNIT) CAPS capsule Take 1 capsule (50,000 Units total) by mouth every  7 (seven) days.   No current facility-administered medications on file prior to visit.   Surgical History:  Past Surgical History:  Procedure Laterality Date   LASER ABLATION Left 05/24/2023   Laser ablation of the Left greater saphenous vein with <10 stab phlebectomies   NO PAST SURGERIES     Allergies:  No Known Allergies Family History:  Family History  Problem Relation Age of Onset   Hypertension Paternal Grandmother    Diabetes Paternal Aunt    Anesthesia problems Neg Hx        Objective:    BP 112/70   Pulse (!) 52   Ht 5\' 2"  (1.575 m)   Wt 177 lb 6.4 oz (80.5 kg)   LMP 08/20/2023   BMI 32.45 kg/m   Wt Readings from Last 3 Encounters:  09/18/23 177 lb 6.4 oz (80.5 kg)  05/30/23 177 lb (80.3 kg)  05/24/23 176 lb (79.8 kg)    Physical Exam Vitals and nursing note reviewed.  Constitutional:      General: She is not in acute distress.    Appearance: Normal appearance.  HENT:     Head: Normocephalic and atraumatic.     Right Ear: Hearing, tympanic membrane, ear canal and external ear normal.     Left Ear: Hearing, tympanic membrane, ear canal and external ear normal.     Nose: Nose normal.     Right Sinus: No maxillary sinus tenderness or frontal sinus tenderness.     Left Sinus: No maxillary sinus tenderness or frontal sinus tenderness.     Mouth/Throat:     Lips: Pink.     Mouth: Mucous membranes are moist.     Pharynx: Oropharynx is clear.  Eyes:     General: Lids are normal. Vision grossly intact.     Extraocular Movements: Extraocular movements intact.     Conjunctiva/sclera: Conjunctivae normal.     Pupils: Pupils are equal, round, and reactive to light.     Funduscopic exam:    Right eye: Red reflex present.        Left eye: Red reflex present.    Visual Fields: Right eye visual fields normal and left eye visual fields normal.  Neck:     Thyroid: No thyromegaly.     Vascular: No carotid bruit.  Cardiovascular:     Rate and Rhythm: Normal rate  and regular rhythm.     Chest Wall: PMI is not displaced.     Pulses: Normal pulses.          Dorsalis pedis pulses are 2+ on the right side and 2+ on the left side.       Posterior tibial pulses are 2+ on the right side and 2+ on the left side.  Heart sounds: Normal heart sounds. No murmur heard. Pulmonary:     Effort: Pulmonary effort is normal. No respiratory distress.     Breath sounds: Normal breath sounds.  Abdominal:     General: Abdomen is flat. Bowel sounds are normal. There is no distension.     Palpations: Abdomen is soft. There is no hepatomegaly, splenomegaly or mass.     Tenderness: There is no abdominal tenderness. There is no right CVA tenderness, left CVA tenderness, guarding or rebound.  Musculoskeletal:        General: Normal range of motion.     Cervical back: Full passive range of motion without pain, normal range of motion and neck supple. No tenderness.     Right lower leg: No edema.     Left lower leg: No edema.  Feet:     Left foot:     Toenail Condition: Left toenails are normal.  Lymphadenopathy:     Cervical: No cervical adenopathy.     Upper Body:     Right upper body: No supraclavicular adenopathy.     Left upper body: No supraclavicular adenopathy.  Skin:    General: Skin is warm and dry.     Capillary Refill: Capillary refill takes less than 2 seconds.     Nails: There is no clubbing.  Neurological:     General: No focal deficit present.     Mental Status: She is alert and oriented to person, place, and time.     GCS: GCS eye subscore is 4. GCS verbal subscore is 5. GCS motor subscore is 6.     Sensory: Sensation is intact.     Motor: Motor function is intact.     Coordination: Coordination is intact.     Gait: Gait is intact.     Deep Tendon Reflexes: Reflexes are normal and symmetric.  Psychiatric:        Attention and Perception: Attention normal.        Mood and Affect: Mood normal.        Speech: Speech normal.        Behavior:  Behavior normal. Behavior is cooperative.        Thought Content: Thought content normal.        Cognition and Memory: Cognition and memory normal.        Judgment: Judgment normal.      Results for orders placed or performed in visit on 03/15/23  VITAMIN D 25 Hydroxy (Vit-D Deficiency, Fractures)   Collection Time: 03/15/23  8:45 AM  Result Value Ref Range   Vit D, 25-Hydroxy 31.3 30.0 - 100.0 ng/mL  Hemoglobin A1c   Collection Time: 03/15/23  8:45 AM  Result Value Ref Range   Hgb A1c MFr Bld 5.6 4.8 - 5.6 %   Est. average glucose Bld gHb Est-mCnc 114 mg/dL  CBC with Differential/Platelet   Collection Time: 03/15/23  8:45 AM  Result Value Ref Range   WBC 4.7 3.4 - 10.8 x10E3/uL   RBC 4.82 3.77 - 5.28 x10E6/uL   Hemoglobin 12.6 11.1 - 15.9 g/dL   Hematocrit 16.1 09.6 - 46.6 %   MCV 84 79 - 97 fL   MCH 26.1 (L) 26.6 - 33.0 pg   MCHC 31.2 (L) 31.5 - 35.7 g/dL   RDW 04.5 40.9 - 81.1 %   Platelets 191 150 - 450 x10E3/uL   Neutrophils 57 Not Estab. %   Lymphs 35 Not Estab. %   Monocytes 7 Not Estab. %  Eos 1 Not Estab. %   Basos 0 Not Estab. %   Neutrophils Absolute 2.7 1.4 - 7.0 x10E3/uL   Lymphocytes Absolute 1.6 0.7 - 3.1 x10E3/uL   Monocytes Absolute 0.4 0.1 - 0.9 x10E3/uL   EOS (ABSOLUTE) 0.1 0.0 - 0.4 x10E3/uL   Basophils Absolute 0.0 0.0 - 0.2 x10E3/uL   Immature Granulocytes 0 Not Estab. %   Immature Grans (Abs) 0.0 0.0 - 0.1 x10E3/uL  Iron, TIBC and Ferritin Panel   Collection Time: 03/15/23  8:45 AM  Result Value Ref Range   Total Iron Binding Capacity 402 250 - 450 ug/dL   UIBC 403 474 - 259 ug/dL   Iron 66 27 - 563 ug/dL   Iron Saturation 16 15 - 55 %   Ferritin 14 (L) 15 - 150 ng/mL  Comprehensive metabolic panel   Collection Time: 03/15/23  8:45 AM  Result Value Ref Range   Glucose 90 70 - 99 mg/dL   BUN 10 6 - 20 mg/dL   Creatinine, Ser 8.75 0.57 - 1.00 mg/dL   eGFR 643 >32 RJ/JOA/4.16   BUN/Creatinine Ratio 17 9 - 23   Sodium 137 134 - 144 mmol/L    Potassium 4.5 3.5 - 5.2 mmol/L   Chloride 103 96 - 106 mmol/L   CO2 24 20 - 29 mmol/L   Calcium 9.2 8.7 - 10.2 mg/dL   Total Protein 6.9 6.0 - 8.5 g/dL   Albumin 4.4 3.9 - 4.9 g/dL   Globulin, Total 2.5 1.5 - 4.5 g/dL   Bilirubin Total 0.3 0.0 - 1.2 mg/dL   Alkaline Phosphatase 81 44 - 121 IU/L   AST 21 0 - 40 IU/L   ALT 21 0 - 32 IU/L       Assessment & Plan:   Problem List Items Addressed This Visit     Environmental and seasonal allergies   Chronic allergic rhinitis with current exacerbation. Ineffective allergy shots in the past. Alternating medications for relief. Claritin D causes dry mouth but improves symptoms with rain. Increased inhaler use likely due to allergy exacerbation. - Prescribe additional inhaler for home and purse - Continue Claritin D - Increase water intake for dry mouth - Consider allergist referral if symptoms worsen      Relevant Medications   albuterol (VENTOLIN HFA) 108 (90 Base) MCG/ACT inhaler   Varicose veins of both lower extremities with pain   Varicose veins with significant improvement post-vein stripping/ablation surgery. Benefits expected to last a couple of years.      Encounter for annual physical exam - Primary   CPE completed today. Review of HM activities and recommendations discussed and provided on AVS. Anticipatory guidance, diet, and exercise recommendations provided. Medications, allergies, and hx reviewed and updated as necessary. Orders placed as listed below.  Plan: - Labs ordered. Will make changes as necessary based on results.  - I will review these results and send recommendations via MyChart or a telephone call.  - F/U with CPE in 1 year or sooner for acute/chronic health needs as directed.        Menorrhagia with regular cycle   Heavy menstrual bleeding with clots and prolonged cycles. Previous ultrasound normal. Considering hormonal imbalance. - Order hormone level tests and thyroid      Relevant Orders   Iron,  TIBC and Ferritin Panel   Estradiol   FSH/LH   Progesterone   Prolactin   TSH + free T4   Anemia   Re-evaluate labs. Continue with iron supplementation.  Relevant Orders   CBC with Differential/Platelet   Iron, TIBC and Ferritin Panel   Vitamin B12   Vitamin D deficiency   Re-evaluate with labs. Continue vitamin D supplement.       Relevant Orders   VITAMIN D 25 Hydroxy (Vit-D Deficiency, Fractures)   Prediabetes   Last labs were WNL. Will recheck today to ensure that her levels continue to be stable. Continue to monitor carbohydrate intake and keep to a minimum.       Relevant Orders   CBC with Differential/Platelet   Comprehensive metabolic panel with GFR   Lipid panel   Hemoglobin A1c      Follow up plan: Return in about 1 year (around 09/17/2024) for CPE.  NEXT PREVENTATIVE PHYSICAL DUE IN 1 YEAR.  PATIENT COUNSELING PROVIDED FOR ALL ADULT PATIENTS: A well balanced diet low in saturated fats, cholesterol, and moderation in carbohydrates.  This can be as simple as monitoring portion sizes and cutting back on sugary beverages such as soda and juice to start with.    Daily water consumption of at least 64 ounces.  Physical activity at least 180 minutes per week.  If just starting out, start 10 minutes a day and work your way up.   This can be as simple as taking the stairs instead of the elevator and walking 2-3 laps around the office  purposefully every day.   STD protection, partner selection, and regular testing if high risk.  Limited consumption of alcoholic beverages if alcohol is consumed. For men, I recommend no more than 14 alcoholic beverages per week, spread out throughout the week (max 2 per day). Avoid "binge" drinking or consuming large quantities of alcohol in one setting.  Please let me know if you feel you may need help with reduction or quitting alcohol consumption.   Avoidance of nicotine, if used. Please let me know if you feel you may  need help with reduction or quitting nicotine use.   Daily mental health attention. This can be in the form of 5 minute daily meditation, prayer, journaling, yoga, reflection, etc.  Purposeful attention to your emotions and mental state can significantly improve your overall wellbeing  and  Health.  Please know that I am here to help you with all of your health care goals and am happy to work with you to find a solution that works best for you.  The greatest advice I have received with any changes in life are to take it one step at a time, that even means if all you can focus on is the next 60 seconds, then do that and celebrate your victories.  With any changes in life, you will have set backs, and that is OK. The important thing to remember is, if you have a set back, it is not a failure, it is an opportunity to try again! Screening Testing Mammogram Every 1 -2 years based on history and risk factors Starting at age 24 Pap Smear Ages 21-39 every 3 years Ages 87-65 every 5 years with HPV testing More frequent testing may be required based on results and history Colon Cancer Screening Every 1-10 years based on test performed, risk factors, and history Starting at age 12 Bone Density Screening Every 2-10 years based on history Starting at age 108 for women Recommendations for men differ based on medication usage, history, and risk factors AAA Screening One time ultrasound Men 55-80 years old who have every smoked Lung Cancer Screening Low Dose Lung CT  every 12 months Age 16-80 years with a 30 pack-year smoking history who still smoke or who have quit within the last 15 years   Screening Labs Routine  Labs: Complete Blood Count (CBC), Complete Metabolic Panel (CMP), Cholesterol (Lipid Panel) Every 6-12 months based on history and medications May be recommended more frequently based on current conditions or previous results Hemoglobin A1c Lab Every 3-12 months based on history and  previous results Starting at age 53 or earlier with diagnosis of diabetes, high cholesterol, BMI >26, and/or risk factors Frequent monitoring for patients with diabetes to ensure blood sugar control Thyroid Panel (TSH) Every 6 months based on history, symptoms, and risk factors May be repeated more often if on medication HIV One time testing for all patients 31 and older May be repeated more frequently for patients with increased risk factors or exposure Hepatitis C One time testing for all patients 51 and older May be repeated more frequently for patients with increased risk factors or exposure Gonorrhea, Chlamydia Every 12 months for all sexually active persons 13-24 years Additional monitoring may be recommended for those who are considered high risk or who have symptoms Every 12 months for any woman on birth control, regardless of sexual activity PSA Men 30-15 years old with risk factors Additional screening may be recommended from age 72-69 based on risk factors, symptoms, and history  Vaccine Recommendations Tetanus Booster All adults every 10 years Flu Vaccine All patients 6 months and older every year COVID Vaccine All patients 12 years and older Initial dosing with booster May recommend additional booster based on age and health history HPV Vaccine 2 doses all patients age 84-26 Dosing may be considered for patients over 26 Shingles Vaccine (Shingrix) 2 doses all adults 55 years and older Pneumonia (Pneumovax 23) All adults 65 years and older May recommend earlier dosing based on health history One year apart from Prevnar 13 Pneumonia (Prevnar 36) All adults 65 years and older Dosed 1 year after Pneumovax 23 Pneumonia (Prevnar 20) One time alternative to the two dosing of 13 and 23 For all adults with initial dose of 23, 20 is recommended 1 year later For all adults with initial dose of 13, 23 is still recommended as second option 1 year  later

## 2023-09-18 NOTE — Assessment & Plan Note (Signed)
 Re-evaluate with labs. Continue vitamin D supplement.

## 2023-09-18 NOTE — Assessment & Plan Note (Addendum)
 Chronic allergic rhinitis with current exacerbation. Ineffective allergy shots in the past. Alternating medications for relief. Claritin D causes dry mouth but improves symptoms with rain. Increased inhaler use likely due to allergy exacerbation. - Prescribe additional inhaler for home and purse - Continue Claritin D - Increase water intake for dry mouth - Consider allergist referral if symptoms worsen

## 2023-09-18 NOTE — Assessment & Plan Note (Signed)
 Varicose veins with significant improvement post-vein stripping/ablation surgery. Benefits expected to last a couple of years.

## 2023-09-18 NOTE — Patient Instructions (Signed)

## 2023-09-19 LAB — CBC WITH DIFFERENTIAL/PLATELET
Basophils Absolute: 0 x10E3/uL (ref 0.0–0.2)
Basos: 1 %
EOS (ABSOLUTE): 0.1 x10E3/uL (ref 0.0–0.4)
Eos: 2 %
Hematocrit: 40.4 % (ref 34.0–46.6)
Hemoglobin: 13 g/dL (ref 11.1–15.9)
Immature Grans (Abs): 0 x10E3/uL (ref 0.0–0.1)
Immature Granulocytes: 0 %
Lymphocytes Absolute: 2.2 x10E3/uL (ref 0.7–3.1)
Lymphs: 34 %
MCH: 26.5 pg — ABNORMAL LOW (ref 26.6–33.0)
MCHC: 32.2 g/dL (ref 31.5–35.7)
MCV: 82 fL (ref 79–97)
Monocytes Absolute: 0.4 x10E3/uL (ref 0.1–0.9)
Monocytes: 6 %
Neutrophils Absolute: 3.8 x10E3/uL (ref 1.4–7.0)
Neutrophils: 57 %
Platelets: 197 x10E3/uL (ref 150–450)
RBC: 4.91 x10E6/uL (ref 3.77–5.28)
RDW: 13.9 % (ref 11.7–15.4)
WBC: 6.6 x10E3/uL (ref 3.4–10.8)

## 2023-09-19 LAB — TSH+FREE T4
Free T4: 0.95 ng/dL (ref 0.82–1.77)
TSH: 1.49 u[IU]/mL (ref 0.450–4.500)

## 2023-09-19 LAB — VITAMIN B12: Vitamin B-12: 551 pg/mL (ref 232–1245)

## 2023-09-19 LAB — COMPREHENSIVE METABOLIC PANEL WITH GFR
ALT: 15 IU/L (ref 0–32)
AST: 16 IU/L (ref 0–40)
Albumin: 4.4 g/dL (ref 3.9–4.9)
Alkaline Phosphatase: 87 IU/L (ref 44–121)
BUN/Creatinine Ratio: 13 (ref 9–23)
BUN: 7 mg/dL (ref 6–20)
Bilirubin Total: 0.3 mg/dL (ref 0.0–1.2)
CO2: 22 mmol/L (ref 20–29)
Calcium: 9.6 mg/dL (ref 8.7–10.2)
Chloride: 104 mmol/L (ref 96–106)
Creatinine, Ser: 0.54 mg/dL — ABNORMAL LOW (ref 0.57–1.00)
Globulin, Total: 2.6 g/dL (ref 1.5–4.5)
Glucose: 88 mg/dL (ref 70–99)
Potassium: 4.4 mmol/L (ref 3.5–5.2)
Sodium: 137 mmol/L (ref 134–144)
Total Protein: 7 g/dL (ref 6.0–8.5)
eGFR: 126 mL/min/{1.73_m2} (ref >59)

## 2023-09-19 LAB — IRON,TIBC AND FERRITIN PANEL
Ferritin: 12 ng/mL — ABNORMAL LOW (ref 15–150)
Iron Saturation: 10 % — ABNORMAL LOW (ref 15–55)
Iron: 41 ug/dL (ref 27–159)
Total Iron Binding Capacity: 392 ug/dL (ref 250–450)
UIBC: 351 ug/dL (ref 131–425)

## 2023-09-19 LAB — PROGESTERONE: Progesterone: 6.7 ng/mL

## 2023-09-19 LAB — LIPID PANEL
Chol/HDL Ratio: 3 ratio (ref 0.0–4.4)
Cholesterol, Total: 151 mg/dL (ref 100–199)
HDL: 51 mg/dL (ref >39)
LDL Chol Calc (NIH): 82 mg/dL (ref 0–99)
Triglycerides: 94 mg/dL (ref 0–149)
VLDL Cholesterol Cal: 18 mg/dL (ref 5–40)

## 2023-09-19 LAB — HEMOGLOBIN A1C
Est. average glucose Bld gHb Est-mCnc: 108 mg/dL
Hgb A1c MFr Bld: 5.4 % (ref 4.8–5.6)

## 2023-09-19 LAB — SPECIMEN STATUS REPORT

## 2023-09-19 LAB — PROLACTIN: Prolactin: 9.5 ng/mL (ref 4.8–33.4)

## 2023-09-19 LAB — VITAMIN D 25 HYDROXY (VIT D DEFICIENCY, FRACTURES): Vit D, 25-Hydroxy: 29.2 ng/mL — ABNORMAL LOW (ref 30.0–100.0)

## 2023-09-19 LAB — ESTRADIOL: Estradiol: 188 pg/mL

## 2023-09-19 LAB — FSH/LH
FSH: 1.5 m[IU]/mL
LH: 2.1 m[IU]/mL

## 2023-09-20 ENCOUNTER — Other Ambulatory Visit: Payer: Self-pay | Admitting: Nurse Practitioner

## 2023-09-20 ENCOUNTER — Encounter: Payer: Self-pay | Admitting: Nurse Practitioner

## 2023-09-20 DIAGNOSIS — E559 Vitamin D deficiency, unspecified: Secondary | ICD-10-CM

## 2023-09-20 MED ORDER — VITAMIN D (ERGOCALCIFEROL) 1.25 MG (50000 UNIT) PO CAPS
50000.0000 [IU] | ORAL_CAPSULE | ORAL | 3 refills | Status: AC
Start: 2023-09-20 — End: ?

## 2024-10-06 ENCOUNTER — Encounter: Payer: Self-pay | Admitting: Nurse Practitioner
